# Patient Record
Sex: Male | Born: 1955 | Race: White | Hispanic: No | Marital: Single | Smoking: Never smoker
Health system: Southern US, Community
[De-identification: ages and names within clinical notes are randomized; demographics above are authoritative.]

## PROBLEM LIST (undated history)

## (undated) DIAGNOSIS — I10 Essential (primary) hypertension: Secondary | ICD-10-CM

## (undated) DIAGNOSIS — I2510 Atherosclerotic heart disease of native coronary artery without angina pectoris: Secondary | ICD-10-CM

## (undated) DIAGNOSIS — N289 Disorder of kidney and ureter, unspecified: Secondary | ICD-10-CM

## (undated) DIAGNOSIS — J309 Allergic rhinitis, unspecified: Secondary | ICD-10-CM

## (undated) DIAGNOSIS — J9600 Acute respiratory failure, unspecified whether with hypoxia or hypercapnia: Secondary | ICD-10-CM

## (undated) DIAGNOSIS — D649 Anemia, unspecified: Secondary | ICD-10-CM

## (undated) DIAGNOSIS — E785 Hyperlipidemia, unspecified: Secondary | ICD-10-CM

## (undated) DIAGNOSIS — S1093XA Contusion of unspecified part of neck, initial encounter: Secondary | ICD-10-CM

## (undated) DIAGNOSIS — N529 Male erectile dysfunction, unspecified: Secondary | ICD-10-CM

## (undated) DIAGNOSIS — H9313 Tinnitus, bilateral: Secondary | ICD-10-CM

## (undated) DIAGNOSIS — N183 Chronic kidney disease, stage 3 unspecified: Secondary | ICD-10-CM

## (undated) HISTORY — PX: CARDIAC SURGERY: SHX584

## (undated) HISTORY — DX: Essential (primary) hypertension: I10

## (undated) HISTORY — PX: CARDIAC CATHETERIZATION: SHX172

## (undated) HISTORY — DX: Acute respiratory failure, unspecified whether with hypoxia or hypercapnia: J96.00

## (undated) HISTORY — DX: Tinnitus, bilateral: H93.13

## (undated) HISTORY — DX: Chronic kidney disease, stage 3 unspecified: N18.30

## (undated) HISTORY — DX: Contusion of unspecified part of neck, initial encounter: S10.93XA

## (undated) HISTORY — DX: Anemia, unspecified: D64.9

## (undated) HISTORY — DX: Male erectile dysfunction, unspecified: N52.9

## (undated) HISTORY — PX: ADENOIDECTOMY: SUR15

## (undated) HISTORY — DX: Allergic rhinitis, unspecified: J30.9

## (undated) HISTORY — DX: Hyperlipidemia, unspecified: E78.5

## (undated) HISTORY — DX: Disorder of kidney and ureter, unspecified: N28.9

## (undated) HISTORY — PX: COLONOSCOPY: SHX174

## (undated) HISTORY — DX: Atherosclerotic heart disease of native coronary artery without angina pectoris: I25.10

---

## 2010-05-10 DIAGNOSIS — IMO0001 Reserved for inherently not codable concepts without codable children: Secondary | ICD-10-CM

## 2010-05-10 DIAGNOSIS — E1165 Type 2 diabetes mellitus with hyperglycemia: Secondary | ICD-10-CM

## 2010-06-20 ENCOUNTER — Encounter (INDEPENDENT_AMBULATORY_CARE_PROVIDER_SITE_OTHER): Payer: BC Managed Care – PPO

## 2010-06-20 DIAGNOSIS — I2510 Atherosclerotic heart disease of native coronary artery without angina pectoris: Secondary | ICD-10-CM

## 2010-06-21 NOTE — Assessment & Plan Note (Unsigned)
[facility] [place] OFFICE VISIT  RONDELL, JOHNKE DOB:  Sep 04, 1955                                        June 20, 2010 CHART #:  [id number]  We saw Mr. Jihan Lauby in the clinic today following his coronary bypass grafting.  Mr. Wisor is doing well.  His sternum is stable and his wounds are well healed.  He has started on walking program and he is managing his diabetes better than he was prior to surgery.  Mr. Madriaga has seen the cardiologist and they are pleased with his progress.  He did inquire about [facility] and will begin that as soon as they contact him.  He will contact [facility] himself if they have not contacted him within a week or so.  Mr. Pomplun was reminded of his sternal precautions, not to lift any more than 15 pounds for the next 6 weeks, and he will return to see [place] on a p.r.n. basis.  Ishmael Holter. Jerelene Redden, MD  BC/MEDQ  D:  06/20/2010  T:  06/21/2010  Job:  [id number]

## 2012-05-25 HISTORY — PX: COLONOSCOPY: SHX174

## 2015-03-24 DIAGNOSIS — E785 Hyperlipidemia, unspecified: Secondary | ICD-10-CM

## 2015-03-24 DIAGNOSIS — Z951 Presence of aortocoronary bypass graft: Secondary | ICD-10-CM

## 2015-03-24 DIAGNOSIS — E119 Type 2 diabetes mellitus without complications: Secondary | ICD-10-CM

## 2015-03-24 DIAGNOSIS — G4733 Obstructive sleep apnea (adult) (pediatric): Secondary | ICD-10-CM

## 2015-03-24 HISTORY — DX: Morbid (severe) obesity due to excess calories: E66.01

## 2015-03-24 HISTORY — DX: Type 2 diabetes mellitus without complications: E11.9

## 2015-03-24 HISTORY — DX: Obstructive sleep apnea (adult) (pediatric): G47.33

## 2015-03-24 HISTORY — DX: Presence of aortocoronary bypass graft: Z95.1

## 2015-03-24 HISTORY — DX: Hyperlipidemia, unspecified: E78.5

## 2018-04-10 DIAGNOSIS — N529 Male erectile dysfunction, unspecified: Secondary | ICD-10-CM | POA: Diagnosis not present

## 2018-04-10 DIAGNOSIS — F329 Major depressive disorder, single episode, unspecified: Secondary | ICD-10-CM | POA: Diagnosis not present

## 2018-04-10 DIAGNOSIS — J208 Acute bronchitis due to other specified organisms: Secondary | ICD-10-CM | POA: Diagnosis not present

## 2018-04-10 DIAGNOSIS — J309 Allergic rhinitis, unspecified: Secondary | ICD-10-CM | POA: Diagnosis not present

## 2018-04-23 DIAGNOSIS — I10 Essential (primary) hypertension: Secondary | ICD-10-CM | POA: Diagnosis not present

## 2018-04-23 DIAGNOSIS — N529 Male erectile dysfunction, unspecified: Secondary | ICD-10-CM | POA: Diagnosis not present

## 2018-04-23 DIAGNOSIS — E119 Type 2 diabetes mellitus without complications: Secondary | ICD-10-CM | POA: Diagnosis not present

## 2018-04-23 DIAGNOSIS — F329 Major depressive disorder, single episode, unspecified: Secondary | ICD-10-CM | POA: Diagnosis not present

## 2018-04-23 DIAGNOSIS — J309 Allergic rhinitis, unspecified: Secondary | ICD-10-CM | POA: Diagnosis not present

## 2018-05-11 DIAGNOSIS — E119 Type 2 diabetes mellitus without complications: Secondary | ICD-10-CM | POA: Diagnosis not present

## 2018-05-11 DIAGNOSIS — N529 Male erectile dysfunction, unspecified: Secondary | ICD-10-CM | POA: Diagnosis not present

## 2018-05-11 DIAGNOSIS — F329 Major depressive disorder, single episode, unspecified: Secondary | ICD-10-CM | POA: Diagnosis not present

## 2018-05-11 DIAGNOSIS — J309 Allergic rhinitis, unspecified: Secondary | ICD-10-CM | POA: Diagnosis not present

## 2018-05-25 DIAGNOSIS — E119 Type 2 diabetes mellitus without complications: Secondary | ICD-10-CM | POA: Diagnosis not present

## 2018-05-25 DIAGNOSIS — J309 Allergic rhinitis, unspecified: Secondary | ICD-10-CM | POA: Diagnosis not present

## 2018-05-25 DIAGNOSIS — N529 Male erectile dysfunction, unspecified: Secondary | ICD-10-CM | POA: Diagnosis not present

## 2018-05-25 DIAGNOSIS — F329 Major depressive disorder, single episode, unspecified: Secondary | ICD-10-CM | POA: Diagnosis not present

## 2018-06-17 DIAGNOSIS — E119 Type 2 diabetes mellitus without complications: Secondary | ICD-10-CM | POA: Diagnosis not present

## 2018-06-17 DIAGNOSIS — J309 Allergic rhinitis, unspecified: Secondary | ICD-10-CM | POA: Diagnosis not present

## 2018-06-17 DIAGNOSIS — E785 Hyperlipidemia, unspecified: Secondary | ICD-10-CM | POA: Diagnosis not present

## 2018-06-17 DIAGNOSIS — I10 Essential (primary) hypertension: Secondary | ICD-10-CM | POA: Diagnosis not present

## 2018-06-17 DIAGNOSIS — N529 Male erectile dysfunction, unspecified: Secondary | ICD-10-CM | POA: Diagnosis not present

## 2018-06-17 DIAGNOSIS — F329 Major depressive disorder, single episode, unspecified: Secondary | ICD-10-CM | POA: Diagnosis not present

## 2018-06-30 DIAGNOSIS — N183 Chronic kidney disease, stage 3 (moderate): Secondary | ICD-10-CM | POA: Diagnosis not present

## 2018-06-30 DIAGNOSIS — N529 Male erectile dysfunction, unspecified: Secondary | ICD-10-CM | POA: Diagnosis not present

## 2018-06-30 DIAGNOSIS — E119 Type 2 diabetes mellitus without complications: Secondary | ICD-10-CM | POA: Diagnosis not present

## 2018-06-30 DIAGNOSIS — F329 Major depressive disorder, single episode, unspecified: Secondary | ICD-10-CM | POA: Diagnosis not present

## 2018-09-16 DIAGNOSIS — Z1331 Encounter for screening for depression: Secondary | ICD-10-CM | POA: Diagnosis not present

## 2018-09-16 DIAGNOSIS — I10 Essential (primary) hypertension: Secondary | ICD-10-CM | POA: Diagnosis not present

## 2018-09-16 DIAGNOSIS — F329 Major depressive disorder, single episode, unspecified: Secondary | ICD-10-CM | POA: Diagnosis not present

## 2018-09-16 DIAGNOSIS — H9313 Tinnitus, bilateral: Secondary | ICD-10-CM | POA: Diagnosis not present

## 2018-09-16 DIAGNOSIS — J309 Allergic rhinitis, unspecified: Secondary | ICD-10-CM | POA: Diagnosis not present

## 2018-09-16 DIAGNOSIS — E785 Hyperlipidemia, unspecified: Secondary | ICD-10-CM | POA: Diagnosis not present

## 2018-09-16 DIAGNOSIS — E119 Type 2 diabetes mellitus without complications: Secondary | ICD-10-CM | POA: Diagnosis not present

## 2018-09-28 DIAGNOSIS — H9313 Tinnitus, bilateral: Secondary | ICD-10-CM | POA: Diagnosis not present

## 2018-09-28 DIAGNOSIS — Z9889 Other specified postprocedural states: Secondary | ICD-10-CM | POA: Diagnosis not present

## 2018-09-28 DIAGNOSIS — D649 Anemia, unspecified: Secondary | ICD-10-CM | POA: Diagnosis not present

## 2018-09-28 DIAGNOSIS — H9193 Unspecified hearing loss, bilateral: Secondary | ICD-10-CM | POA: Diagnosis not present

## 2018-09-28 DIAGNOSIS — Z822 Family history of deafness and hearing loss: Secondary | ICD-10-CM | POA: Diagnosis not present

## 2018-09-28 DIAGNOSIS — I10 Essential (primary) hypertension: Secondary | ICD-10-CM | POA: Diagnosis not present

## 2018-09-28 DIAGNOSIS — N183 Chronic kidney disease, stage 3 (moderate): Secondary | ICD-10-CM | POA: Diagnosis not present

## 2018-09-28 DIAGNOSIS — H903 Sensorineural hearing loss, bilateral: Secondary | ICD-10-CM | POA: Diagnosis not present

## 2018-09-28 DIAGNOSIS — E785 Hyperlipidemia, unspecified: Secondary | ICD-10-CM | POA: Diagnosis not present

## 2018-10-12 DIAGNOSIS — I10 Essential (primary) hypertension: Secondary | ICD-10-CM | POA: Diagnosis not present

## 2018-10-12 DIAGNOSIS — E785 Hyperlipidemia, unspecified: Secondary | ICD-10-CM | POA: Diagnosis not present

## 2018-10-12 DIAGNOSIS — L0390 Cellulitis, unspecified: Secondary | ICD-10-CM | POA: Diagnosis not present

## 2018-10-12 DIAGNOSIS — N183 Chronic kidney disease, stage 3 (moderate): Secondary | ICD-10-CM | POA: Diagnosis not present

## 2018-10-20 DIAGNOSIS — L0390 Cellulitis, unspecified: Secondary | ICD-10-CM | POA: Diagnosis not present

## 2018-10-20 DIAGNOSIS — I10 Essential (primary) hypertension: Secondary | ICD-10-CM | POA: Diagnosis not present

## 2018-10-20 DIAGNOSIS — E785 Hyperlipidemia, unspecified: Secondary | ICD-10-CM | POA: Diagnosis not present

## 2018-10-20 DIAGNOSIS — N529 Male erectile dysfunction, unspecified: Secondary | ICD-10-CM | POA: Diagnosis not present

## 2018-11-26 DIAGNOSIS — N529 Male erectile dysfunction, unspecified: Secondary | ICD-10-CM | POA: Diagnosis not present

## 2018-11-26 DIAGNOSIS — E785 Hyperlipidemia, unspecified: Secondary | ICD-10-CM | POA: Diagnosis not present

## 2018-11-26 DIAGNOSIS — I10 Essential (primary) hypertension: Secondary | ICD-10-CM | POA: Diagnosis not present

## 2018-11-26 DIAGNOSIS — L0390 Cellulitis, unspecified: Secondary | ICD-10-CM | POA: Diagnosis not present

## 2018-12-02 DIAGNOSIS — L0390 Cellulitis, unspecified: Secondary | ICD-10-CM | POA: Diagnosis not present

## 2018-12-02 DIAGNOSIS — I10 Essential (primary) hypertension: Secondary | ICD-10-CM | POA: Diagnosis not present

## 2018-12-02 DIAGNOSIS — E119 Type 2 diabetes mellitus without complications: Secondary | ICD-10-CM | POA: Diagnosis not present

## 2018-12-02 DIAGNOSIS — N529 Male erectile dysfunction, unspecified: Secondary | ICD-10-CM | POA: Diagnosis not present

## 2018-12-02 DIAGNOSIS — E785 Hyperlipidemia, unspecified: Secondary | ICD-10-CM | POA: Diagnosis not present

## 2018-12-09 DIAGNOSIS — E785 Hyperlipidemia, unspecified: Secondary | ICD-10-CM | POA: Diagnosis not present

## 2018-12-09 DIAGNOSIS — I10 Essential (primary) hypertension: Secondary | ICD-10-CM | POA: Diagnosis not present

## 2018-12-09 DIAGNOSIS — E162 Hypoglycemia, unspecified: Secondary | ICD-10-CM | POA: Diagnosis not present

## 2018-12-09 DIAGNOSIS — E118 Type 2 diabetes mellitus with unspecified complications: Secondary | ICD-10-CM | POA: Diagnosis not present

## 2020-02-18 DIAGNOSIS — Z139 Encounter for screening, unspecified: Secondary | ICD-10-CM | POA: Diagnosis not present

## 2020-02-18 DIAGNOSIS — E669 Obesity, unspecified: Secondary | ICD-10-CM | POA: Diagnosis not present

## 2020-02-18 DIAGNOSIS — Z9181 History of falling: Secondary | ICD-10-CM | POA: Diagnosis not present

## 2020-02-18 DIAGNOSIS — Z0000 Encounter for general adult medical examination without abnormal findings: Secondary | ICD-10-CM | POA: Diagnosis not present

## 2020-02-18 DIAGNOSIS — Z1331 Encounter for screening for depression: Secondary | ICD-10-CM | POA: Diagnosis not present

## 2020-02-18 DIAGNOSIS — E785 Hyperlipidemia, unspecified: Secondary | ICD-10-CM | POA: Diagnosis not present

## 2020-06-09 DIAGNOSIS — N1832 Chronic kidney disease, stage 3b: Secondary | ICD-10-CM | POA: Diagnosis not present

## 2020-06-09 DIAGNOSIS — J309 Allergic rhinitis, unspecified: Secondary | ICD-10-CM | POA: Diagnosis not present

## 2020-06-09 DIAGNOSIS — L309 Dermatitis, unspecified: Secondary | ICD-10-CM | POA: Diagnosis not present

## 2020-06-09 DIAGNOSIS — E1169 Type 2 diabetes mellitus with other specified complication: Secondary | ICD-10-CM | POA: Diagnosis not present

## 2020-06-09 DIAGNOSIS — D649 Anemia, unspecified: Secondary | ICD-10-CM | POA: Diagnosis not present

## 2020-06-09 DIAGNOSIS — N529 Male erectile dysfunction, unspecified: Secondary | ICD-10-CM | POA: Diagnosis not present

## 2020-06-09 DIAGNOSIS — F3341 Major depressive disorder, recurrent, in partial remission: Secondary | ICD-10-CM | POA: Diagnosis not present

## 2020-06-09 DIAGNOSIS — H9313 Tinnitus, bilateral: Secondary | ICD-10-CM | POA: Diagnosis not present

## 2020-06-09 DIAGNOSIS — E785 Hyperlipidemia, unspecified: Secondary | ICD-10-CM | POA: Diagnosis not present

## 2020-06-09 DIAGNOSIS — I10 Essential (primary) hypertension: Secondary | ICD-10-CM | POA: Diagnosis not present

## 2020-06-09 DIAGNOSIS — I2510 Atherosclerotic heart disease of native coronary artery without angina pectoris: Secondary | ICD-10-CM | POA: Diagnosis not present

## 2020-06-26 DIAGNOSIS — J309 Allergic rhinitis, unspecified: Secondary | ICD-10-CM | POA: Diagnosis not present

## 2020-06-26 DIAGNOSIS — E785 Hyperlipidemia, unspecified: Secondary | ICD-10-CM | POA: Diagnosis not present

## 2020-06-26 DIAGNOSIS — E1169 Type 2 diabetes mellitus with other specified complication: Secondary | ICD-10-CM | POA: Diagnosis not present

## 2020-06-26 DIAGNOSIS — I2510 Atherosclerotic heart disease of native coronary artery without angina pectoris: Secondary | ICD-10-CM | POA: Diagnosis not present

## 2020-06-26 DIAGNOSIS — D649 Anemia, unspecified: Secondary | ICD-10-CM | POA: Diagnosis not present

## 2020-06-26 DIAGNOSIS — I10 Essential (primary) hypertension: Secondary | ICD-10-CM | POA: Diagnosis not present

## 2020-06-26 DIAGNOSIS — F3341 Major depressive disorder, recurrent, in partial remission: Secondary | ICD-10-CM | POA: Diagnosis not present

## 2020-06-26 DIAGNOSIS — N1832 Chronic kidney disease, stage 3b: Secondary | ICD-10-CM | POA: Diagnosis not present

## 2020-06-26 DIAGNOSIS — N529 Male erectile dysfunction, unspecified: Secondary | ICD-10-CM | POA: Diagnosis not present

## 2020-06-26 DIAGNOSIS — H9313 Tinnitus, bilateral: Secondary | ICD-10-CM | POA: Diagnosis not present

## 2020-06-26 DIAGNOSIS — L309 Dermatitis, unspecified: Secondary | ICD-10-CM | POA: Diagnosis not present

## 2020-07-10 DIAGNOSIS — J309 Allergic rhinitis, unspecified: Secondary | ICD-10-CM | POA: Diagnosis not present

## 2020-07-10 DIAGNOSIS — N529 Male erectile dysfunction, unspecified: Secondary | ICD-10-CM | POA: Diagnosis not present

## 2020-07-10 DIAGNOSIS — I10 Essential (primary) hypertension: Secondary | ICD-10-CM | POA: Diagnosis not present

## 2020-07-10 DIAGNOSIS — L309 Dermatitis, unspecified: Secondary | ICD-10-CM | POA: Diagnosis not present

## 2020-07-10 DIAGNOSIS — I2510 Atherosclerotic heart disease of native coronary artery without angina pectoris: Secondary | ICD-10-CM | POA: Diagnosis not present

## 2020-07-10 DIAGNOSIS — E1169 Type 2 diabetes mellitus with other specified complication: Secondary | ICD-10-CM | POA: Diagnosis not present

## 2020-07-10 DIAGNOSIS — H9313 Tinnitus, bilateral: Secondary | ICD-10-CM | POA: Diagnosis not present

## 2020-07-10 DIAGNOSIS — F3341 Major depressive disorder, recurrent, in partial remission: Secondary | ICD-10-CM | POA: Diagnosis not present

## 2020-07-10 DIAGNOSIS — E785 Hyperlipidemia, unspecified: Secondary | ICD-10-CM | POA: Diagnosis not present

## 2020-07-10 DIAGNOSIS — N1832 Chronic kidney disease, stage 3b: Secondary | ICD-10-CM | POA: Diagnosis not present

## 2020-07-10 DIAGNOSIS — D649 Anemia, unspecified: Secondary | ICD-10-CM | POA: Diagnosis not present

## 2020-07-20 DIAGNOSIS — N529 Male erectile dysfunction, unspecified: Secondary | ICD-10-CM | POA: Diagnosis not present

## 2020-07-20 DIAGNOSIS — E1169 Type 2 diabetes mellitus with other specified complication: Secondary | ICD-10-CM | POA: Diagnosis not present

## 2020-07-20 DIAGNOSIS — J309 Allergic rhinitis, unspecified: Secondary | ICD-10-CM | POA: Diagnosis not present

## 2020-07-20 DIAGNOSIS — R42 Dizziness and giddiness: Secondary | ICD-10-CM | POA: Diagnosis not present

## 2020-07-20 DIAGNOSIS — N1832 Chronic kidney disease, stage 3b: Secondary | ICD-10-CM | POA: Diagnosis not present

## 2020-07-20 DIAGNOSIS — H9313 Tinnitus, bilateral: Secondary | ICD-10-CM | POA: Diagnosis not present

## 2020-07-20 DIAGNOSIS — I2510 Atherosclerotic heart disease of native coronary artery without angina pectoris: Secondary | ICD-10-CM | POA: Diagnosis not present

## 2020-07-20 DIAGNOSIS — D649 Anemia, unspecified: Secondary | ICD-10-CM | POA: Diagnosis not present

## 2020-07-20 DIAGNOSIS — F3341 Major depressive disorder, recurrent, in partial remission: Secondary | ICD-10-CM | POA: Diagnosis not present

## 2020-07-20 DIAGNOSIS — E785 Hyperlipidemia, unspecified: Secondary | ICD-10-CM | POA: Diagnosis not present

## 2020-07-20 DIAGNOSIS — L309 Dermatitis, unspecified: Secondary | ICD-10-CM | POA: Diagnosis not present

## 2020-07-24 DIAGNOSIS — I2510 Atherosclerotic heart disease of native coronary artery without angina pectoris: Secondary | ICD-10-CM | POA: Diagnosis not present

## 2020-07-24 DIAGNOSIS — E1169 Type 2 diabetes mellitus with other specified complication: Secondary | ICD-10-CM | POA: Diagnosis not present

## 2020-07-24 DIAGNOSIS — D649 Anemia, unspecified: Secondary | ICD-10-CM | POA: Diagnosis not present

## 2020-07-24 DIAGNOSIS — J309 Allergic rhinitis, unspecified: Secondary | ICD-10-CM | POA: Diagnosis not present

## 2020-07-24 DIAGNOSIS — R42 Dizziness and giddiness: Secondary | ICD-10-CM | POA: Diagnosis not present

## 2020-07-24 DIAGNOSIS — L309 Dermatitis, unspecified: Secondary | ICD-10-CM | POA: Diagnosis not present

## 2020-07-24 DIAGNOSIS — H9313 Tinnitus, bilateral: Secondary | ICD-10-CM | POA: Diagnosis not present

## 2020-07-24 DIAGNOSIS — E785 Hyperlipidemia, unspecified: Secondary | ICD-10-CM | POA: Diagnosis not present

## 2020-07-24 DIAGNOSIS — N1832 Chronic kidney disease, stage 3b: Secondary | ICD-10-CM | POA: Diagnosis not present

## 2020-07-24 DIAGNOSIS — N529 Male erectile dysfunction, unspecified: Secondary | ICD-10-CM | POA: Diagnosis not present

## 2020-07-24 DIAGNOSIS — F3341 Major depressive disorder, recurrent, in partial remission: Secondary | ICD-10-CM | POA: Diagnosis not present

## 2020-09-06 DIAGNOSIS — J309 Allergic rhinitis, unspecified: Secondary | ICD-10-CM | POA: Diagnosis not present

## 2020-09-06 DIAGNOSIS — D649 Anemia, unspecified: Secondary | ICD-10-CM | POA: Diagnosis not present

## 2020-09-06 DIAGNOSIS — I10 Essential (primary) hypertension: Secondary | ICD-10-CM | POA: Diagnosis not present

## 2020-09-06 DIAGNOSIS — F3341 Major depressive disorder, recurrent, in partial remission: Secondary | ICD-10-CM | POA: Diagnosis not present

## 2020-09-06 DIAGNOSIS — L309 Dermatitis, unspecified: Secondary | ICD-10-CM | POA: Diagnosis not present

## 2020-09-06 DIAGNOSIS — E1169 Type 2 diabetes mellitus with other specified complication: Secondary | ICD-10-CM | POA: Diagnosis not present

## 2020-09-06 DIAGNOSIS — I2510 Atherosclerotic heart disease of native coronary artery without angina pectoris: Secondary | ICD-10-CM | POA: Diagnosis not present

## 2020-09-06 DIAGNOSIS — N529 Male erectile dysfunction, unspecified: Secondary | ICD-10-CM | POA: Diagnosis not present

## 2020-09-06 DIAGNOSIS — N1832 Chronic kidney disease, stage 3b: Secondary | ICD-10-CM | POA: Diagnosis not present

## 2020-09-06 DIAGNOSIS — E785 Hyperlipidemia, unspecified: Secondary | ICD-10-CM | POA: Diagnosis not present

## 2020-09-06 DIAGNOSIS — H9313 Tinnitus, bilateral: Secondary | ICD-10-CM | POA: Diagnosis not present

## 2020-09-07 DIAGNOSIS — E785 Hyperlipidemia, unspecified: Secondary | ICD-10-CM | POA: Diagnosis not present

## 2020-09-07 DIAGNOSIS — L309 Dermatitis, unspecified: Secondary | ICD-10-CM | POA: Diagnosis not present

## 2020-09-07 DIAGNOSIS — J309 Allergic rhinitis, unspecified: Secondary | ICD-10-CM | POA: Diagnosis not present

## 2020-09-07 DIAGNOSIS — F3341 Major depressive disorder, recurrent, in partial remission: Secondary | ICD-10-CM | POA: Diagnosis not present

## 2020-09-07 DIAGNOSIS — N529 Male erectile dysfunction, unspecified: Secondary | ICD-10-CM | POA: Diagnosis not present

## 2020-09-07 DIAGNOSIS — H9313 Tinnitus, bilateral: Secondary | ICD-10-CM | POA: Diagnosis not present

## 2020-09-07 DIAGNOSIS — I2510 Atherosclerotic heart disease of native coronary artery without angina pectoris: Secondary | ICD-10-CM | POA: Diagnosis not present

## 2020-09-07 DIAGNOSIS — D649 Anemia, unspecified: Secondary | ICD-10-CM | POA: Diagnosis not present

## 2020-09-07 DIAGNOSIS — E1169 Type 2 diabetes mellitus with other specified complication: Secondary | ICD-10-CM | POA: Diagnosis not present

## 2020-09-07 DIAGNOSIS — I10 Essential (primary) hypertension: Secondary | ICD-10-CM | POA: Diagnosis not present

## 2020-09-07 DIAGNOSIS — N1832 Chronic kidney disease, stage 3b: Secondary | ICD-10-CM | POA: Diagnosis not present

## 2020-09-10 DIAGNOSIS — M19022 Primary osteoarthritis, left elbow: Secondary | ICD-10-CM | POA: Diagnosis not present

## 2020-09-10 DIAGNOSIS — Z041 Encounter for examination and observation following transport accident: Secondary | ICD-10-CM | POA: Diagnosis not present

## 2020-09-10 DIAGNOSIS — I10 Essential (primary) hypertension: Secondary | ICD-10-CM | POA: Diagnosis not present

## 2020-09-10 DIAGNOSIS — R0902 Hypoxemia: Secondary | ICD-10-CM | POA: Diagnosis not present

## 2020-09-12 ENCOUNTER — Encounter: Payer: Self-pay | Admitting: Cardiology

## 2020-09-12 ENCOUNTER — Encounter: Payer: Self-pay | Admitting: *Deleted

## 2020-09-14 DIAGNOSIS — L309 Dermatitis, unspecified: Secondary | ICD-10-CM | POA: Diagnosis not present

## 2020-09-14 DIAGNOSIS — N1832 Chronic kidney disease, stage 3b: Secondary | ICD-10-CM | POA: Diagnosis not present

## 2020-09-14 DIAGNOSIS — D649 Anemia, unspecified: Secondary | ICD-10-CM | POA: Diagnosis not present

## 2020-09-14 DIAGNOSIS — E785 Hyperlipidemia, unspecified: Secondary | ICD-10-CM | POA: Diagnosis not present

## 2020-09-14 DIAGNOSIS — E1169 Type 2 diabetes mellitus with other specified complication: Secondary | ICD-10-CM | POA: Diagnosis not present

## 2020-09-14 DIAGNOSIS — Z6841 Body Mass Index (BMI) 40.0 and over, adult: Secondary | ICD-10-CM | POA: Diagnosis not present

## 2020-09-14 DIAGNOSIS — I10 Essential (primary) hypertension: Secondary | ICD-10-CM | POA: Diagnosis not present

## 2020-09-14 DIAGNOSIS — F3341 Major depressive disorder, recurrent, in partial remission: Secondary | ICD-10-CM | POA: Diagnosis not present

## 2020-09-14 DIAGNOSIS — J309 Allergic rhinitis, unspecified: Secondary | ICD-10-CM | POA: Diagnosis not present

## 2020-09-14 DIAGNOSIS — H9313 Tinnitus, bilateral: Secondary | ICD-10-CM | POA: Diagnosis not present

## 2020-09-14 DIAGNOSIS — N529 Male erectile dysfunction, unspecified: Secondary | ICD-10-CM | POA: Diagnosis not present

## 2020-10-04 DIAGNOSIS — N1832 Chronic kidney disease, stage 3b: Secondary | ICD-10-CM | POA: Diagnosis not present

## 2020-10-04 DIAGNOSIS — I10 Essential (primary) hypertension: Secondary | ICD-10-CM | POA: Diagnosis not present

## 2020-10-04 DIAGNOSIS — J309 Allergic rhinitis, unspecified: Secondary | ICD-10-CM | POA: Diagnosis not present

## 2020-10-04 DIAGNOSIS — I2510 Atherosclerotic heart disease of native coronary artery without angina pectoris: Secondary | ICD-10-CM | POA: Diagnosis not present

## 2020-10-04 DIAGNOSIS — M545 Low back pain, unspecified: Secondary | ICD-10-CM | POA: Diagnosis not present

## 2020-10-04 DIAGNOSIS — H9313 Tinnitus, bilateral: Secondary | ICD-10-CM | POA: Diagnosis not present

## 2020-10-04 DIAGNOSIS — F3341 Major depressive disorder, recurrent, in partial remission: Secondary | ICD-10-CM | POA: Diagnosis not present

## 2020-10-04 DIAGNOSIS — N529 Male erectile dysfunction, unspecified: Secondary | ICD-10-CM | POA: Diagnosis not present

## 2020-10-04 DIAGNOSIS — L309 Dermatitis, unspecified: Secondary | ICD-10-CM | POA: Diagnosis not present

## 2020-10-04 DIAGNOSIS — E785 Hyperlipidemia, unspecified: Secondary | ICD-10-CM | POA: Diagnosis not present

## 2020-10-04 DIAGNOSIS — D649 Anemia, unspecified: Secondary | ICD-10-CM | POA: Diagnosis not present

## 2020-10-04 DIAGNOSIS — E1169 Type 2 diabetes mellitus with other specified complication: Secondary | ICD-10-CM | POA: Diagnosis not present

## 2020-10-12 ENCOUNTER — Ambulatory Visit: Payer: Medicare Other | Admitting: Cardiology

## 2020-10-26 DIAGNOSIS — M5442 Lumbago with sciatica, left side: Secondary | ICD-10-CM | POA: Diagnosis not present

## 2020-10-27 DIAGNOSIS — I2510 Atherosclerotic heart disease of native coronary artery without angina pectoris: Secondary | ICD-10-CM | POA: Insufficient documentation

## 2020-10-27 DIAGNOSIS — J309 Allergic rhinitis, unspecified: Secondary | ICD-10-CM | POA: Insufficient documentation

## 2020-10-27 DIAGNOSIS — N529 Male erectile dysfunction, unspecified: Secondary | ICD-10-CM | POA: Insufficient documentation

## 2020-10-27 DIAGNOSIS — D649 Anemia, unspecified: Secondary | ICD-10-CM | POA: Insufficient documentation

## 2020-10-27 DIAGNOSIS — N289 Disorder of kidney and ureter, unspecified: Secondary | ICD-10-CM | POA: Insufficient documentation

## 2020-10-27 DIAGNOSIS — H9313 Tinnitus, bilateral: Secondary | ICD-10-CM | POA: Insufficient documentation

## 2020-10-27 DIAGNOSIS — N183 Chronic kidney disease, stage 3 unspecified: Secondary | ICD-10-CM | POA: Insufficient documentation

## 2020-10-27 DIAGNOSIS — I10 Essential (primary) hypertension: Secondary | ICD-10-CM | POA: Insufficient documentation

## 2020-11-01 DIAGNOSIS — L03213 Periorbital cellulitis: Secondary | ICD-10-CM | POA: Diagnosis not present

## 2020-11-01 DIAGNOSIS — N529 Male erectile dysfunction, unspecified: Secondary | ICD-10-CM | POA: Diagnosis not present

## 2020-11-01 DIAGNOSIS — N1832 Chronic kidney disease, stage 3b: Secondary | ICD-10-CM | POA: Diagnosis not present

## 2020-11-01 DIAGNOSIS — E785 Hyperlipidemia, unspecified: Secondary | ICD-10-CM | POA: Diagnosis not present

## 2020-11-01 DIAGNOSIS — I2510 Atherosclerotic heart disease of native coronary artery without angina pectoris: Secondary | ICD-10-CM | POA: Diagnosis not present

## 2020-11-01 DIAGNOSIS — M6281 Muscle weakness (generalized): Secondary | ICD-10-CM | POA: Diagnosis not present

## 2020-11-01 DIAGNOSIS — D649 Anemia, unspecified: Secondary | ICD-10-CM | POA: Diagnosis not present

## 2020-11-01 DIAGNOSIS — M545 Low back pain, unspecified: Secondary | ICD-10-CM | POA: Diagnosis not present

## 2020-11-01 DIAGNOSIS — F3341 Major depressive disorder, recurrent, in partial remission: Secondary | ICD-10-CM | POA: Diagnosis not present

## 2020-11-01 DIAGNOSIS — E1169 Type 2 diabetes mellitus with other specified complication: Secondary | ICD-10-CM | POA: Diagnosis not present

## 2020-11-01 DIAGNOSIS — L309 Dermatitis, unspecified: Secondary | ICD-10-CM | POA: Diagnosis not present

## 2020-11-01 DIAGNOSIS — H9313 Tinnitus, bilateral: Secondary | ICD-10-CM | POA: Diagnosis not present

## 2020-11-01 DIAGNOSIS — J309 Allergic rhinitis, unspecified: Secondary | ICD-10-CM | POA: Diagnosis not present

## 2020-11-01 DIAGNOSIS — R293 Abnormal posture: Secondary | ICD-10-CM | POA: Diagnosis not present

## 2020-11-01 DIAGNOSIS — M2560 Stiffness of unspecified joint, not elsewhere classified: Secondary | ICD-10-CM | POA: Diagnosis not present

## 2020-11-07 DIAGNOSIS — I2510 Atherosclerotic heart disease of native coronary artery without angina pectoris: Secondary | ICD-10-CM | POA: Diagnosis not present

## 2020-11-07 DIAGNOSIS — R293 Abnormal posture: Secondary | ICD-10-CM | POA: Diagnosis not present

## 2020-11-07 DIAGNOSIS — D649 Anemia, unspecified: Secondary | ICD-10-CM | POA: Diagnosis not present

## 2020-11-07 DIAGNOSIS — F3341 Major depressive disorder, recurrent, in partial remission: Secondary | ICD-10-CM | POA: Diagnosis not present

## 2020-11-07 DIAGNOSIS — E1169 Type 2 diabetes mellitus with other specified complication: Secondary | ICD-10-CM | POA: Diagnosis not present

## 2020-11-07 DIAGNOSIS — E785 Hyperlipidemia, unspecified: Secondary | ICD-10-CM | POA: Diagnosis not present

## 2020-11-07 DIAGNOSIS — M6281 Muscle weakness (generalized): Secondary | ICD-10-CM | POA: Diagnosis not present

## 2020-11-07 DIAGNOSIS — L03213 Periorbital cellulitis: Secondary | ICD-10-CM | POA: Diagnosis not present

## 2020-11-07 DIAGNOSIS — J309 Allergic rhinitis, unspecified: Secondary | ICD-10-CM | POA: Diagnosis not present

## 2020-11-07 DIAGNOSIS — N529 Male erectile dysfunction, unspecified: Secondary | ICD-10-CM | POA: Diagnosis not present

## 2020-11-07 DIAGNOSIS — N1832 Chronic kidney disease, stage 3b: Secondary | ICD-10-CM | POA: Diagnosis not present

## 2020-11-07 DIAGNOSIS — M2560 Stiffness of unspecified joint, not elsewhere classified: Secondary | ICD-10-CM | POA: Diagnosis not present

## 2020-11-07 DIAGNOSIS — M545 Low back pain, unspecified: Secondary | ICD-10-CM | POA: Diagnosis not present

## 2020-11-07 DIAGNOSIS — L309 Dermatitis, unspecified: Secondary | ICD-10-CM | POA: Diagnosis not present

## 2020-11-07 DIAGNOSIS — H9313 Tinnitus, bilateral: Secondary | ICD-10-CM | POA: Diagnosis not present

## 2020-11-09 NOTE — Progress Notes (Signed)
Cardiology Office Note:    Date:  11/10/2020   ID:  Mario Green, DOB [birth year], MRN [id number]  PCP:  Cher Nakai, MD  Cardiologist:  Shirlee More, MD   Referring MD: Cher Nakai, MD  ASSESSMENT:    1. Coronary artery disease of native artery of native heart with stable angina pectoris ([place])   2. CKD (chronic kidney disease), symptom management only, stage 3 (moderate) (HCC)   3. Mixed hyperlipidemia   4. Type 2 diabetes mellitus without complication, unspecified whether long term insulin use (HCC)    PLAN:    In order of problems listed above:  Stable CAD following revascularization having no angina he is on a good medical regimen including clopidogrel beta-blocker but not on a statin or statin equivalent and I am going to place him on a combination bempedoic acid Zetia and if tolerated follow-up labs with his PCP Followed by nephrology awaiting evaluation Poorly controlled initiate bempedoic acid Poorly controlled managed by his PCP A1c above target  Next appointment 6 months   Medication Adjustments/Labs and Tests Ordered: Current medicines are reviewed at length with the patient today.  Concerns regarding medicines are outlined above.  Orders Placed This Encounter  Procedures   EKG 12-Lead   Meds ordered this encounter  Medications   Bempedoic Acid-Ezetimibe 180-10 MG TABS    Sig: Take 1 tablet by mouth daily.    Dispense:  90 tablet    Refill:  3     Chief Complaint  Patient presents with   Coronary Artery Disease    History of Present Illness:    Mario Green is a 65 y.o. male who is being seen today for the evaluation of CAD with a history of CABG December 2016 at the request of Cher Nakai, MD.  He had previously been seen by my partner Dr. Agustin Cree [facility] regional cardiology last 05/06/2016. He had coronary angiography performed 04/24/2015 showing patent grafts and no recommendation for further intervention.  I will cut-and-paste the record from care  everywhere to the chart..  1. Severe LAD/RCA lesions  2. Cir with mild disease  3. All grafts are widely patent (LIMA to LAD, SVG to RCA, SVG to Diag )  4. LVEDP 12, ventriculogram was not performed due to renal insuff. Normal by  recent non-invasive test.  Diagnostic Procedure Recommendations  Medical treatment.   Signatures   Electronically signed by Rozann Lesches, MD(Diagnostic Physician) on   04/24/2015 12:15   Angiographic findings   Cardiac Arteries and Lesion Findings  LMCA: Normal.  LAD:    Lesion on Prox LAD: 95% stenosis.    Lesion on Mid LAD: 95% stenosis.  LCx:    Lesion on Prox CX: 20% stenosis.    Lesion on Mid CX: 40% stenosis.    Lesion on 1st Ob Marg: 30% stenosis.  RCA:    Lesion on Prox RCA: Ostial.100% stenosis.  Cardiac Grafts   -  There is a Vein graft that originates at the Aorta Left and attaches to      the 1st Diag.patent   -  There is a Vein graft that originates at the Aorta Right and attaches to      the Mid RCA.patnet   -  There is a LIMA graft that originates at the LIMA and attaches to the Mid      LAD.patent   In general he has done well since bypass surgery no recurrent hospitalizations is not having angina shortness of breath palpitation or  syncope. At times shifting posture in the bed he feels a click in the sternum there is no instability on exam He has CKD his last creatinine 2.8 and is awaiting follow-up with a nephrologist. He had been in a drug trial with lipid-lowering which was discontinued the last few months he is taking no treatment at this time.  His last cholesterol 257 LDL severely elevated 156 triglycerides 140 and HDL 16. He is statin intolerant. 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 LIMA to LAD, SVG to diagonal 1, SVG to PDA   Tinnitus, bilateral    Type 2 diabetes mellitus without complication ([place]) 0000000    Past Surgical History:  Procedure Laterality Date   ADENOIDECTOMY     CARDIAC SURGERY     Tripple bypass    Current Medications: Current Meds  Medication Sig   amoxicillin-clavulanate (AUGMENTIN) 875-125 MG tablet Take 1 tablet by mouth 2 (two) times daily.   aspirin 81 MG EC tablet Take 81 mg by mouth daily.   Bempedoic Acid-Ezetimibe 180-10 MG TABS Take 1 tablet by mouth daily.   clopidogrel (PLAVIX) 75 MG tablet Take 1 tablet by mouth daily.   Dulaglutide (TRULICITY) 4.5 0000000 SOPN Inject 4.5 mg into the skin once a week.   fenofibrate 160 MG tablet Take 160 mg by mouth daily.   gabapentin (NEURONTIN) 300 MG capsule Take 300 mg by mouth at bedtime.   glimepiride (AMARYL) 4 MG tablet Take 4 mg by mouth daily.   hydrochlorothiazide (HYDRODIURIL) 25 MG tablet Take 25 mg by mouth daily.   lisinopril (ZESTRIL) 10 MG tablet Take 10 mg by mouth daily.   metFORMIN (GLUCOPHAGE) 850 MG tablet Take 850 mg by mouth 3 (three) times daily.   metoprolol tartrate (LOPRESSOR) 100 MG tablet Take 100 mg by mouth daily.   tadalafil (CIALIS) 10 MG tablet Take 10 mg by mouth daily as needed for erectile dysfunction.     Allergies:   Patient has no known allergies.   Social History   Socioeconomic History   Marital status: Single    Spouse name: Not on file   Number of children: Not on file   Years of education: Not on file   Highest education level: Not on file  Occupational History   Not on file  Tobacco Use   Smoking status:  Never   Smokeless tobacco: Former    Types: Chew  Substance and Sexual Activity   Alcohol use: Not Currently    Comment: occasional   Drug use: Never   Sexual activity: Not on file  Other Topics Concern   Not on file  Social History Narrative   Not on file   Social Determinants of Health   Financial Resource Strain: Not on file  Food Insecurity: Not on file  Transportation Needs: Not on file  Physical Activity: Not on file  Stress: Not on file  Social Connections:  Not on file     Family History: The patient's family history includes Dementia in his mother; Diabetes in his father; Heart attack in his father.  ROS:   ROS Please see the history of present illness.     All other systems reviewed and are negative.  EKGs/Labs/Other Studies Reviewed:    The following studies were reviewed today:   EKG:  EKG is  ordered today.  The ekg ordered today is personally reviewed and demonstrates sinus rhythm marked first-degree AV block nonspecific conduction delay    Physical Exam:    VS:  BP 110/70 (BP Location: Left Arm, Patient Position: Sitting, Cuff Size: Normal)   Pulse 68   Ht 5' 11.75" (1.822 m)   Wt (!) 302 lb (137 kg)   SpO2 97%   BMI 41.24 kg/m     Wt Readings from Last 3 Encounters:  11/10/20 (!) 302 lb (137 kg)     GEN: Obese BMI greater than 40 well nourished, well developed in no acute distress HEENT: Normal NECK: No JVD; No carotid bruits LYMPHATICS: No lymphadenopathy CARDIAC: RRR, no murmurs, rubs, gallops RESPIRATORY:  Clear to auscultation without rales, wheezing or rhonchi  ABDOMEN: Soft, non-tender, non-distended MUSCULOSKELETAL:  No edema; No deformity  SKIN: Warm and dry NEUROLOGIC:  Alert and oriented x 3 PSYCHIATRIC:  Normal affect     Signed, Shirlee More, MD  11/10/2020 11:21 AM    [place]

## 2020-11-10 ENCOUNTER — Other Ambulatory Visit: Payer: Self-pay

## 2020-11-10 ENCOUNTER — Encounter: Payer: Self-pay | Admitting: Cardiology

## 2020-11-10 ENCOUNTER — Ambulatory Visit (INDEPENDENT_AMBULATORY_CARE_PROVIDER_SITE_OTHER): Payer: Medicare Other | Admitting: Cardiology

## 2020-11-10 VITALS — BP 110/70 | HR 68 | Ht 71.75 in | Wt 302.0 lb

## 2020-11-10 DIAGNOSIS — N183 Chronic kidney disease, stage 3 unspecified: Secondary | ICD-10-CM

## 2020-11-10 DIAGNOSIS — R293 Abnormal posture: Secondary | ICD-10-CM | POA: Diagnosis not present

## 2020-11-10 DIAGNOSIS — E782 Mixed hyperlipidemia: Secondary | ICD-10-CM | POA: Diagnosis not present

## 2020-11-10 DIAGNOSIS — M545 Low back pain, unspecified: Secondary | ICD-10-CM | POA: Diagnosis not present

## 2020-11-10 DIAGNOSIS — M2560 Stiffness of unspecified joint, not elsewhere classified: Secondary | ICD-10-CM | POA: Diagnosis not present

## 2020-11-10 DIAGNOSIS — E119 Type 2 diabetes mellitus without complications: Secondary | ICD-10-CM | POA: Diagnosis not present

## 2020-11-10 DIAGNOSIS — I25118 Atherosclerotic heart disease of native coronary artery with other forms of angina pectoris: Secondary | ICD-10-CM | POA: Diagnosis not present

## 2020-11-10 DIAGNOSIS — M6281 Muscle weakness (generalized): Secondary | ICD-10-CM | POA: Diagnosis not present

## 2020-11-10 MED ORDER — BEMPEDOIC ACID-EZETIMIBE 180-10 MG PO TABS
1.0000 | ORAL_TABLET | Freq: Every day | ORAL | 3 refills | Status: DC
Start: 2020-11-10 — End: 2021-05-14

## 2020-11-10 NOTE — Patient Instructions (Signed)
Medication Instructions:  Your physician has recommended you make the following change in your medication:  START: Bempedoic acid-zetia 180/10 mg take one tablet by mouth daily.  *If you need a refill on your cardiac medications before your next appointment, please call your pharmacy*   Lab Work: None If you have labs (blood work) drawn today and your tests are completely normal, you will receive your results only by: [place] (if you have MyChart) OR A paper copy in the mail If you have any lab test that is abnormal or we need to change your treatment, we will call you to review the results.   Testing/Procedures: None   Follow-Up: At [facility], you and your health needs are our priority.  As part of our continuing mission to provide you with exceptional heart care, we have created designated Provider Care Teams.  These Care Teams include your primary Cardiologist (physician) and Advanced Practice Providers (APPs -  Physician Assistants and Nurse Practitioners) who all work together to provide you with the care you need, when you need it.  We recommend signing up for the patient portal called "MyChart".  Sign up information is provided on this After Visit Summary.  MyChart is used to connect with patients for Virtual Visits (Telemedicine).  Patients are able to view lab/test results, encounter notes, upcoming appointments, etc.  Non-urgent messages can be sent to your provider as well.   To learn more about what you can do with MyChart, go to [email or web address].    Your next appointment:   6 month(s)  The format for your next appointment:   In Person  Provider:   Shirlee More, MD   Other Instructions

## 2020-11-14 DIAGNOSIS — M2560 Stiffness of unspecified joint, not elsewhere classified: Secondary | ICD-10-CM | POA: Diagnosis not present

## 2020-11-14 DIAGNOSIS — M6281 Muscle weakness (generalized): Secondary | ICD-10-CM | POA: Diagnosis not present

## 2020-11-14 DIAGNOSIS — M545 Low back pain, unspecified: Secondary | ICD-10-CM | POA: Diagnosis not present

## 2020-11-14 DIAGNOSIS — R293 Abnormal posture: Secondary | ICD-10-CM | POA: Diagnosis not present

## 2020-11-17 DIAGNOSIS — E785 Hyperlipidemia, unspecified: Secondary | ICD-10-CM | POA: Diagnosis not present

## 2020-11-17 DIAGNOSIS — E1122 Type 2 diabetes mellitus with diabetic chronic kidney disease: Secondary | ICD-10-CM | POA: Diagnosis not present

## 2020-11-17 DIAGNOSIS — I129 Hypertensive chronic kidney disease with stage 1 through stage 4 chronic kidney disease, or unspecified chronic kidney disease: Secondary | ICD-10-CM | POA: Diagnosis not present

## 2020-11-17 DIAGNOSIS — N1832 Chronic kidney disease, stage 3b: Secondary | ICD-10-CM | POA: Diagnosis not present

## 2020-11-17 DIAGNOSIS — N179 Acute kidney failure, unspecified: Secondary | ICD-10-CM | POA: Diagnosis not present

## 2020-11-20 ENCOUNTER — Other Ambulatory Visit: Payer: Self-pay | Admitting: Internal Medicine

## 2020-11-20 DIAGNOSIS — N1832 Chronic kidney disease, stage 3b: Secondary | ICD-10-CM

## 2020-11-20 DIAGNOSIS — N179 Acute kidney failure, unspecified: Secondary | ICD-10-CM

## 2020-11-20 DIAGNOSIS — E875 Hyperkalemia: Secondary | ICD-10-CM | POA: Diagnosis not present

## 2020-11-21 DIAGNOSIS — H9313 Tinnitus, bilateral: Secondary | ICD-10-CM | POA: Diagnosis not present

## 2020-11-21 DIAGNOSIS — M2560 Stiffness of unspecified joint, not elsewhere classified: Secondary | ICD-10-CM | POA: Diagnosis not present

## 2020-11-21 DIAGNOSIS — E1169 Type 2 diabetes mellitus with other specified complication: Secondary | ICD-10-CM | POA: Diagnosis not present

## 2020-11-21 DIAGNOSIS — D649 Anemia, unspecified: Secondary | ICD-10-CM | POA: Diagnosis not present

## 2020-11-21 DIAGNOSIS — E785 Hyperlipidemia, unspecified: Secondary | ICD-10-CM | POA: Diagnosis not present

## 2020-11-21 DIAGNOSIS — I2510 Atherosclerotic heart disease of native coronary artery without angina pectoris: Secondary | ICD-10-CM | POA: Diagnosis not present

## 2020-11-21 DIAGNOSIS — L309 Dermatitis, unspecified: Secondary | ICD-10-CM | POA: Diagnosis not present

## 2020-11-21 DIAGNOSIS — J309 Allergic rhinitis, unspecified: Secondary | ICD-10-CM | POA: Diagnosis not present

## 2020-11-21 DIAGNOSIS — R293 Abnormal posture: Secondary | ICD-10-CM | POA: Diagnosis not present

## 2020-11-21 DIAGNOSIS — N529 Male erectile dysfunction, unspecified: Secondary | ICD-10-CM | POA: Diagnosis not present

## 2020-11-21 DIAGNOSIS — Z6841 Body Mass Index (BMI) 40.0 and over, adult: Secondary | ICD-10-CM | POA: Diagnosis not present

## 2020-11-21 DIAGNOSIS — N1832 Chronic kidney disease, stage 3b: Secondary | ICD-10-CM | POA: Diagnosis not present

## 2020-11-21 DIAGNOSIS — M6281 Muscle weakness (generalized): Secondary | ICD-10-CM | POA: Diagnosis not present

## 2020-11-21 DIAGNOSIS — F3341 Major depressive disorder, recurrent, in partial remission: Secondary | ICD-10-CM | POA: Diagnosis not present

## 2020-11-21 DIAGNOSIS — M545 Low back pain, unspecified: Secondary | ICD-10-CM | POA: Diagnosis not present

## 2020-11-22 ENCOUNTER — Ambulatory Visit
Admission: RE | Admit: 2020-11-22 | Discharge: 2020-11-22 | Disposition: A | Discharge status: Home / Self Care | Payer: Medicare Other | Source: Ambulatory Visit | Attending: Internal Medicine | Admitting: Internal Medicine

## 2020-11-22 ENCOUNTER — Other Ambulatory Visit: Payer: Self-pay

## 2020-11-22 ENCOUNTER — Telehealth: Payer: Self-pay

## 2020-11-22 DIAGNOSIS — N1832 Chronic kidney disease, stage 3b: Secondary | ICD-10-CM

## 2020-11-22 DIAGNOSIS — N179 Acute kidney failure, unspecified: Secondary | ICD-10-CM

## 2020-11-22 DIAGNOSIS — N181 Chronic kidney disease, stage 1: Secondary | ICD-10-CM | POA: Diagnosis not present

## 2020-11-22 NOTE — Telephone Encounter (Signed)
Prior Auth for [employer]. Status is pending, confirmation number: [id number]

## 2020-11-23 ENCOUNTER — Telehealth: Payer: Self-pay | Admitting: Cardiology

## 2020-11-23 DIAGNOSIS — M545 Low back pain, unspecified: Secondary | ICD-10-CM | POA: Diagnosis not present

## 2020-11-23 DIAGNOSIS — M2560 Stiffness of unspecified joint, not elsewhere classified: Secondary | ICD-10-CM | POA: Diagnosis not present

## 2020-11-23 DIAGNOSIS — R293 Abnormal posture: Secondary | ICD-10-CM | POA: Diagnosis not present

## 2020-11-23 DIAGNOSIS — E785 Hyperlipidemia, unspecified: Secondary | ICD-10-CM

## 2020-11-23 DIAGNOSIS — M6281 Muscle weakness (generalized): Secondary | ICD-10-CM | POA: Diagnosis not present

## 2020-11-23 NOTE — Telephone Encounter (Signed)
   Pt c/o medication issue:  1. Name of Medication: Nexletol  2. How are you currently taking this medication (dosage and times per day)?   3. Are you having a reaction (difficulty breathing--STAT)?   4. What is your medication issue? Pt said after the insurance approved this med, its still going to cost him $800. He said he cant afford that and if Dr. Bettina Gavia can prescribed something cheaper instead

## 2020-11-24 ENCOUNTER — Other Ambulatory Visit: Payer: Self-pay | Admitting: Internal Medicine

## 2020-11-24 DIAGNOSIS — I129 Hypertensive chronic kidney disease with stage 1 through stage 4 chronic kidney disease, or unspecified chronic kidney disease: Secondary | ICD-10-CM

## 2020-11-24 DIAGNOSIS — N179 Acute kidney failure, unspecified: Secondary | ICD-10-CM

## 2020-11-24 DIAGNOSIS — N1832 Chronic kidney disease, stage 3b: Secondary | ICD-10-CM

## 2020-11-24 NOTE — Telephone Encounter (Signed)
Left message on patients voicemail to please return our call.   

## 2020-11-29 ENCOUNTER — Telehealth: Payer: Self-pay

## 2020-11-29 NOTE — Telephone Encounter (Signed)
ONALD Anastasi KeyCaro Laroche - PA Case ID: [id number] - Rx #[id number] Need help? Call us at [phone] Outcome Approved on August 18 PA Case: [id number], Status: Approved, Coverage Starts on: 11/22/2020 12:00:00 AM, Coverage Ends on: 04/07/2021 12:00:00 AM.   Called patient regarding approval of Nexlizet who states that it was going to still cost him around $800.00 for 90 day supply even with the approval and that he can not afford the cost. Told patient that I would make Dr Bettina Gavia aware for further instructions. Patient voices understanding and agreed. I did contact his pharmacy to verify the cost for patient after his insurance was told that a 30 day supply after patients insurance would be $393.00.

## 2020-11-30 DIAGNOSIS — D649 Anemia, unspecified: Secondary | ICD-10-CM | POA: Diagnosis not present

## 2020-11-30 DIAGNOSIS — J309 Allergic rhinitis, unspecified: Secondary | ICD-10-CM | POA: Diagnosis not present

## 2020-11-30 DIAGNOSIS — I10 Essential (primary) hypertension: Secondary | ICD-10-CM | POA: Diagnosis not present

## 2020-11-30 DIAGNOSIS — I2510 Atherosclerotic heart disease of native coronary artery without angina pectoris: Secondary | ICD-10-CM | POA: Diagnosis not present

## 2020-11-30 DIAGNOSIS — H9313 Tinnitus, bilateral: Secondary | ICD-10-CM | POA: Diagnosis not present

## 2020-11-30 DIAGNOSIS — N529 Male erectile dysfunction, unspecified: Secondary | ICD-10-CM | POA: Diagnosis not present

## 2020-11-30 DIAGNOSIS — L309 Dermatitis, unspecified: Secondary | ICD-10-CM | POA: Diagnosis not present

## 2020-11-30 DIAGNOSIS — M545 Low back pain, unspecified: Secondary | ICD-10-CM | POA: Diagnosis not present

## 2020-11-30 DIAGNOSIS — F3341 Major depressive disorder, recurrent, in partial remission: Secondary | ICD-10-CM | POA: Diagnosis not present

## 2020-11-30 DIAGNOSIS — E1169 Type 2 diabetes mellitus with other specified complication: Secondary | ICD-10-CM | POA: Diagnosis not present

## 2020-11-30 DIAGNOSIS — N1832 Chronic kidney disease, stage 3b: Secondary | ICD-10-CM | POA: Diagnosis not present

## 2020-12-04 NOTE — Addendum Note (Signed)
Addended by: Resa Miner I on: 12/04/2020 09:08 AM   Modules accepted: Orders

## 2020-12-04 NOTE — Telephone Encounter (Signed)
Spoke to the patient just now and went over Dr. Joya Gaskins recommendations with him. He verbalizes understanding and thanks me for the call back.

## 2020-12-05 DIAGNOSIS — M545 Low back pain, unspecified: Secondary | ICD-10-CM | POA: Diagnosis not present

## 2020-12-05 DIAGNOSIS — R293 Abnormal posture: Secondary | ICD-10-CM | POA: Diagnosis not present

## 2020-12-05 DIAGNOSIS — M6281 Muscle weakness (generalized): Secondary | ICD-10-CM | POA: Diagnosis not present

## 2020-12-05 DIAGNOSIS — M2560 Stiffness of unspecified joint, not elsewhere classified: Secondary | ICD-10-CM | POA: Diagnosis not present

## 2020-12-06 ENCOUNTER — Ambulatory Visit (INDEPENDENT_AMBULATORY_CARE_PROVIDER_SITE_OTHER): Payer: Medicare Other | Admitting: Pharmacist Clinician (PhC)/ Clinical Pharmacy Specialist

## 2020-12-06 ENCOUNTER — Other Ambulatory Visit: Payer: Self-pay

## 2020-12-06 DIAGNOSIS — E785 Hyperlipidemia, unspecified: Secondary | ICD-10-CM | POA: Diagnosis not present

## 2020-12-06 NOTE — Assessment & Plan Note (Signed)
Patient with elevated LDL cholesterol, unable to tolerate statin drugs.  He is on Medicare and has a plan with $480 up front deductible for branded medications.  PA approved for Nexlizet, however he is unable to afford that deductible.  Unfortunately the manufacturer does not have any patient assistance programs to help with this.  Reviewed the option of Repatha, as he can apply to [place] directly due to financial constraints.  Patient agreeable to this, showed him how to use pen in office and explained side effects, mechanism and potential drop in cholesterol.  He will fill out the paperwork for assistance and bring back in the next week or so and we will hopefully get approval within a few weeks.

## 2020-12-06 NOTE — Patient Instructions (Addendum)
Your Results:             Your most recent labs Goal  Total Cholesterol 257 < 200  Triglycerides 140 < 150  HDL (happy/good cholesterol) 16 > 40  LDL (lousy/bad cholesterol 156 < 70     Medication changes:  We will start the process to get Repatha covered by your insurance.  You will do an injection once every 14 days.    Please fill out the patient assistance paperwork and mail it back ASAP.       Thank you for choosing [facility]
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
## 2020-12-07 DIAGNOSIS — R293 Abnormal posture: Secondary | ICD-10-CM | POA: Diagnosis not present

## 2020-12-07 DIAGNOSIS — M5442 Lumbago with sciatica, left side: Secondary | ICD-10-CM | POA: Diagnosis not present

## 2020-12-07 DIAGNOSIS — M6281 Muscle weakness (generalized): Secondary | ICD-10-CM | POA: Diagnosis not present

## 2020-12-07 DIAGNOSIS — M2560 Stiffness of unspecified joint, not elsewhere classified: Secondary | ICD-10-CM | POA: Diagnosis not present

## 2020-12-07 DIAGNOSIS — M545 Low back pain, unspecified: Secondary | ICD-10-CM | POA: Diagnosis not present

## 2020-12-12 DIAGNOSIS — M2560 Stiffness of unspecified joint, not elsewhere classified: Secondary | ICD-10-CM | POA: Diagnosis not present

## 2020-12-12 DIAGNOSIS — M545 Low back pain, unspecified: Secondary | ICD-10-CM | POA: Diagnosis not present

## 2020-12-12 DIAGNOSIS — R293 Abnormal posture: Secondary | ICD-10-CM | POA: Diagnosis not present

## 2020-12-12 DIAGNOSIS — M6281 Muscle weakness (generalized): Secondary | ICD-10-CM | POA: Diagnosis not present

## 2020-12-19 ENCOUNTER — Telehealth: Payer: Self-pay | Admitting: Cardiology

## 2020-12-19 DIAGNOSIS — M6281 Muscle weakness (generalized): Secondary | ICD-10-CM | POA: Diagnosis not present

## 2020-12-19 DIAGNOSIS — E875 Hyperkalemia: Secondary | ICD-10-CM | POA: Diagnosis not present

## 2020-12-19 DIAGNOSIS — R293 Abnormal posture: Secondary | ICD-10-CM | POA: Diagnosis not present

## 2020-12-19 DIAGNOSIS — N1832 Chronic kidney disease, stage 3b: Secondary | ICD-10-CM | POA: Diagnosis not present

## 2020-12-19 DIAGNOSIS — M2560 Stiffness of unspecified joint, not elsewhere classified: Secondary | ICD-10-CM | POA: Diagnosis not present

## 2020-12-19 DIAGNOSIS — E1122 Type 2 diabetes mellitus with diabetic chronic kidney disease: Secondary | ICD-10-CM | POA: Diagnosis not present

## 2020-12-19 DIAGNOSIS — E785 Hyperlipidemia, unspecified: Secondary | ICD-10-CM | POA: Diagnosis not present

## 2020-12-19 DIAGNOSIS — M545 Low back pain, unspecified: Secondary | ICD-10-CM | POA: Diagnosis not present

## 2020-12-19 DIAGNOSIS — I129 Hypertensive chronic kidney disease with stage 1 through stage 4 chronic kidney disease, or unspecified chronic kidney disease: Secondary | ICD-10-CM | POA: Diagnosis not present

## 2020-12-19 DIAGNOSIS — N179 Acute kidney failure, unspecified: Secondary | ICD-10-CM | POA: Diagnosis not present

## 2020-12-19 NOTE — Telephone Encounter (Signed)
   Pt c/o medication issue:  1. Name of Medication: Nexlizet  2. How are you currently taking this medication (dosage and times per day)?   3. Are you having a reaction (difficulty breathing--STAT)?   4. What is your medication issue? Pt would like to f/u if he got approve for pt assistance

## 2020-12-20 NOTE — Telephone Encounter (Signed)
Called and spoke w/pt and stated that they were approved for the repatha to get it free from the manufacturer for the remainder of the year pt voiced understanding and will call to schedule deliveries

## 2020-12-26 DIAGNOSIS — M2560 Stiffness of unspecified joint, not elsewhere classified: Secondary | ICD-10-CM | POA: Diagnosis not present

## 2020-12-26 DIAGNOSIS — M545 Low back pain, unspecified: Secondary | ICD-10-CM | POA: Diagnosis not present

## 2020-12-26 DIAGNOSIS — R293 Abnormal posture: Secondary | ICD-10-CM | POA: Diagnosis not present

## 2020-12-26 DIAGNOSIS — M6281 Muscle weakness (generalized): Secondary | ICD-10-CM | POA: Diagnosis not present

## 2021-01-02 DIAGNOSIS — M2560 Stiffness of unspecified joint, not elsewhere classified: Secondary | ICD-10-CM | POA: Diagnosis not present

## 2021-01-02 DIAGNOSIS — R293 Abnormal posture: Secondary | ICD-10-CM | POA: Diagnosis not present

## 2021-01-02 DIAGNOSIS — M6281 Muscle weakness (generalized): Secondary | ICD-10-CM | POA: Diagnosis not present

## 2021-01-02 DIAGNOSIS — M545 Low back pain, unspecified: Secondary | ICD-10-CM | POA: Diagnosis not present

## 2021-01-04 DIAGNOSIS — M6281 Muscle weakness (generalized): Secondary | ICD-10-CM | POA: Diagnosis not present

## 2021-01-04 DIAGNOSIS — M2560 Stiffness of unspecified joint, not elsewhere classified: Secondary | ICD-10-CM | POA: Diagnosis not present

## 2021-01-04 DIAGNOSIS — M545 Low back pain, unspecified: Secondary | ICD-10-CM | POA: Diagnosis not present

## 2021-01-04 DIAGNOSIS — R293 Abnormal posture: Secondary | ICD-10-CM | POA: Diagnosis not present

## 2021-01-08 DIAGNOSIS — F3341 Major depressive disorder, recurrent, in partial remission: Secondary | ICD-10-CM | POA: Diagnosis not present

## 2021-01-08 DIAGNOSIS — M545 Low back pain, unspecified: Secondary | ICD-10-CM | POA: Diagnosis not present

## 2021-01-08 DIAGNOSIS — H9313 Tinnitus, bilateral: Secondary | ICD-10-CM | POA: Diagnosis not present

## 2021-01-08 DIAGNOSIS — D649 Anemia, unspecified: Secondary | ICD-10-CM | POA: Diagnosis not present

## 2021-01-08 DIAGNOSIS — J309 Allergic rhinitis, unspecified: Secondary | ICD-10-CM | POA: Diagnosis not present

## 2021-01-08 DIAGNOSIS — N529 Male erectile dysfunction, unspecified: Secondary | ICD-10-CM | POA: Diagnosis not present

## 2021-01-08 DIAGNOSIS — L309 Dermatitis, unspecified: Secondary | ICD-10-CM | POA: Diagnosis not present

## 2021-01-08 DIAGNOSIS — E1169 Type 2 diabetes mellitus with other specified complication: Secondary | ICD-10-CM | POA: Diagnosis not present

## 2021-01-08 DIAGNOSIS — N1832 Chronic kidney disease, stage 3b: Secondary | ICD-10-CM | POA: Diagnosis not present

## 2021-01-08 DIAGNOSIS — I10 Essential (primary) hypertension: Secondary | ICD-10-CM | POA: Diagnosis not present

## 2021-01-08 DIAGNOSIS — I2510 Atherosclerotic heart disease of native coronary artery without angina pectoris: Secondary | ICD-10-CM | POA: Diagnosis not present

## 2021-01-09 DIAGNOSIS — M6281 Muscle weakness (generalized): Secondary | ICD-10-CM | POA: Diagnosis not present

## 2021-01-09 DIAGNOSIS — M2560 Stiffness of unspecified joint, not elsewhere classified: Secondary | ICD-10-CM | POA: Diagnosis not present

## 2021-01-09 DIAGNOSIS — R293 Abnormal posture: Secondary | ICD-10-CM | POA: Diagnosis not present

## 2021-01-09 DIAGNOSIS — M545 Low back pain, unspecified: Secondary | ICD-10-CM | POA: Diagnosis not present

## 2021-01-15 DIAGNOSIS — H9313 Tinnitus, bilateral: Secondary | ICD-10-CM | POA: Diagnosis not present

## 2021-01-15 DIAGNOSIS — N529 Male erectile dysfunction, unspecified: Secondary | ICD-10-CM | POA: Diagnosis not present

## 2021-01-15 DIAGNOSIS — D649 Anemia, unspecified: Secondary | ICD-10-CM | POA: Diagnosis not present

## 2021-01-15 DIAGNOSIS — I2510 Atherosclerotic heart disease of native coronary artery without angina pectoris: Secondary | ICD-10-CM | POA: Diagnosis not present

## 2021-01-15 DIAGNOSIS — L309 Dermatitis, unspecified: Secondary | ICD-10-CM | POA: Diagnosis not present

## 2021-01-15 DIAGNOSIS — N1832 Chronic kidney disease, stage 3b: Secondary | ICD-10-CM | POA: Diagnosis not present

## 2021-01-15 DIAGNOSIS — I10 Essential (primary) hypertension: Secondary | ICD-10-CM | POA: Diagnosis not present

## 2021-01-15 DIAGNOSIS — F3341 Major depressive disorder, recurrent, in partial remission: Secondary | ICD-10-CM | POA: Diagnosis not present

## 2021-01-15 DIAGNOSIS — Z23 Encounter for immunization: Secondary | ICD-10-CM | POA: Diagnosis not present

## 2021-01-15 DIAGNOSIS — J309 Allergic rhinitis, unspecified: Secondary | ICD-10-CM | POA: Diagnosis not present

## 2021-01-15 DIAGNOSIS — E1169 Type 2 diabetes mellitus with other specified complication: Secondary | ICD-10-CM | POA: Diagnosis not present

## 2021-01-16 DIAGNOSIS — M545 Low back pain, unspecified: Secondary | ICD-10-CM | POA: Diagnosis not present

## 2021-01-16 DIAGNOSIS — R293 Abnormal posture: Secondary | ICD-10-CM | POA: Diagnosis not present

## 2021-01-16 DIAGNOSIS — M2560 Stiffness of unspecified joint, not elsewhere classified: Secondary | ICD-10-CM | POA: Diagnosis not present

## 2021-01-16 DIAGNOSIS — M6281 Muscle weakness (generalized): Secondary | ICD-10-CM | POA: Diagnosis not present

## 2021-01-18 DIAGNOSIS — M2560 Stiffness of unspecified joint, not elsewhere classified: Secondary | ICD-10-CM | POA: Diagnosis not present

## 2021-01-18 DIAGNOSIS — M6281 Muscle weakness (generalized): Secondary | ICD-10-CM | POA: Diagnosis not present

## 2021-01-18 DIAGNOSIS — M545 Low back pain, unspecified: Secondary | ICD-10-CM | POA: Diagnosis not present

## 2021-01-18 DIAGNOSIS — R293 Abnormal posture: Secondary | ICD-10-CM | POA: Diagnosis not present

## 2021-01-23 DIAGNOSIS — M6281 Muscle weakness (generalized): Secondary | ICD-10-CM | POA: Diagnosis not present

## 2021-01-23 DIAGNOSIS — R293 Abnormal posture: Secondary | ICD-10-CM | POA: Diagnosis not present

## 2021-01-23 DIAGNOSIS — M2560 Stiffness of unspecified joint, not elsewhere classified: Secondary | ICD-10-CM | POA: Diagnosis not present

## 2021-01-23 DIAGNOSIS — M545 Low back pain, unspecified: Secondary | ICD-10-CM | POA: Diagnosis not present

## 2021-01-29 DIAGNOSIS — I2510 Atherosclerotic heart disease of native coronary artery without angina pectoris: Secondary | ICD-10-CM | POA: Diagnosis not present

## 2021-01-29 DIAGNOSIS — L309 Dermatitis, unspecified: Secondary | ICD-10-CM | POA: Diagnosis not present

## 2021-01-29 DIAGNOSIS — I10 Essential (primary) hypertension: Secondary | ICD-10-CM | POA: Diagnosis not present

## 2021-01-29 DIAGNOSIS — N529 Male erectile dysfunction, unspecified: Secondary | ICD-10-CM | POA: Diagnosis not present

## 2021-01-29 DIAGNOSIS — F3341 Major depressive disorder, recurrent, in partial remission: Secondary | ICD-10-CM | POA: Diagnosis not present

## 2021-01-29 DIAGNOSIS — N1832 Chronic kidney disease, stage 3b: Secondary | ICD-10-CM | POA: Diagnosis not present

## 2021-01-29 DIAGNOSIS — J309 Allergic rhinitis, unspecified: Secondary | ICD-10-CM | POA: Diagnosis not present

## 2021-01-29 DIAGNOSIS — H9313 Tinnitus, bilateral: Secondary | ICD-10-CM | POA: Diagnosis not present

## 2021-01-29 DIAGNOSIS — D649 Anemia, unspecified: Secondary | ICD-10-CM | POA: Diagnosis not present

## 2021-01-29 DIAGNOSIS — E1169 Type 2 diabetes mellitus with other specified complication: Secondary | ICD-10-CM | POA: Diagnosis not present

## 2021-01-29 DIAGNOSIS — M545 Low back pain, unspecified: Secondary | ICD-10-CM | POA: Diagnosis not present

## 2021-01-30 DIAGNOSIS — M545 Low back pain, unspecified: Secondary | ICD-10-CM | POA: Diagnosis not present

## 2021-01-30 DIAGNOSIS — M6281 Muscle weakness (generalized): Secondary | ICD-10-CM | POA: Diagnosis not present

## 2021-01-30 DIAGNOSIS — R293 Abnormal posture: Secondary | ICD-10-CM | POA: Diagnosis not present

## 2021-01-30 DIAGNOSIS — M2560 Stiffness of unspecified joint, not elsewhere classified: Secondary | ICD-10-CM | POA: Diagnosis not present

## 2021-01-31 DIAGNOSIS — I129 Hypertensive chronic kidney disease with stage 1 through stage 4 chronic kidney disease, or unspecified chronic kidney disease: Secondary | ICD-10-CM | POA: Diagnosis not present

## 2021-01-31 DIAGNOSIS — E785 Hyperlipidemia, unspecified: Secondary | ICD-10-CM | POA: Diagnosis not present

## 2021-01-31 DIAGNOSIS — E1122 Type 2 diabetes mellitus with diabetic chronic kidney disease: Secondary | ICD-10-CM | POA: Diagnosis not present

## 2021-01-31 DIAGNOSIS — N179 Acute kidney failure, unspecified: Secondary | ICD-10-CM | POA: Diagnosis not present

## 2021-01-31 DIAGNOSIS — R351 Nocturia: Secondary | ICD-10-CM | POA: Diagnosis not present

## 2021-01-31 DIAGNOSIS — E875 Hyperkalemia: Secondary | ICD-10-CM | POA: Diagnosis not present

## 2021-01-31 DIAGNOSIS — N1832 Chronic kidney disease, stage 3b: Secondary | ICD-10-CM | POA: Diagnosis not present

## 2021-02-01 DIAGNOSIS — M6281 Muscle weakness (generalized): Secondary | ICD-10-CM | POA: Diagnosis not present

## 2021-02-01 DIAGNOSIS — M2560 Stiffness of unspecified joint, not elsewhere classified: Secondary | ICD-10-CM | POA: Diagnosis not present

## 2021-02-01 DIAGNOSIS — R293 Abnormal posture: Secondary | ICD-10-CM | POA: Diagnosis not present

## 2021-02-01 DIAGNOSIS — M545 Low back pain, unspecified: Secondary | ICD-10-CM | POA: Diagnosis not present

## 2021-02-05 DIAGNOSIS — M545 Low back pain, unspecified: Secondary | ICD-10-CM | POA: Diagnosis not present

## 2021-02-05 DIAGNOSIS — M6281 Muscle weakness (generalized): Secondary | ICD-10-CM | POA: Diagnosis not present

## 2021-02-05 DIAGNOSIS — R293 Abnormal posture: Secondary | ICD-10-CM | POA: Diagnosis not present

## 2021-02-05 DIAGNOSIS — M2560 Stiffness of unspecified joint, not elsewhere classified: Secondary | ICD-10-CM | POA: Diagnosis not present

## 2021-02-07 DIAGNOSIS — M5442 Lumbago with sciatica, left side: Secondary | ICD-10-CM | POA: Diagnosis not present

## 2021-02-12 DIAGNOSIS — J309 Allergic rhinitis, unspecified: Secondary | ICD-10-CM | POA: Diagnosis not present

## 2021-02-12 DIAGNOSIS — D649 Anemia, unspecified: Secondary | ICD-10-CM | POA: Diagnosis not present

## 2021-02-12 DIAGNOSIS — I10 Essential (primary) hypertension: Secondary | ICD-10-CM | POA: Diagnosis not present

## 2021-02-12 DIAGNOSIS — L309 Dermatitis, unspecified: Secondary | ICD-10-CM | POA: Diagnosis not present

## 2021-02-12 DIAGNOSIS — F3341 Major depressive disorder, recurrent, in partial remission: Secondary | ICD-10-CM | POA: Diagnosis not present

## 2021-02-12 DIAGNOSIS — N529 Male erectile dysfunction, unspecified: Secondary | ICD-10-CM | POA: Diagnosis not present

## 2021-02-12 DIAGNOSIS — N1832 Chronic kidney disease, stage 3b: Secondary | ICD-10-CM | POA: Diagnosis not present

## 2021-02-12 DIAGNOSIS — H9313 Tinnitus, bilateral: Secondary | ICD-10-CM | POA: Diagnosis not present

## 2021-02-12 DIAGNOSIS — I2510 Atherosclerotic heart disease of native coronary artery without angina pectoris: Secondary | ICD-10-CM | POA: Diagnosis not present

## 2021-02-12 DIAGNOSIS — E1169 Type 2 diabetes mellitus with other specified complication: Secondary | ICD-10-CM | POA: Diagnosis not present

## 2021-02-12 DIAGNOSIS — M545 Low back pain, unspecified: Secondary | ICD-10-CM | POA: Diagnosis not present

## 2021-02-20 DIAGNOSIS — Z6841 Body Mass Index (BMI) 40.0 and over, adult: Secondary | ICD-10-CM | POA: Diagnosis not present

## 2021-02-20 DIAGNOSIS — E669 Obesity, unspecified: Secondary | ICD-10-CM | POA: Diagnosis not present

## 2021-02-20 DIAGNOSIS — Z1331 Encounter for screening for depression: Secondary | ICD-10-CM | POA: Diagnosis not present

## 2021-02-20 DIAGNOSIS — Z0000 Encounter for general adult medical examination without abnormal findings: Secondary | ICD-10-CM | POA: Diagnosis not present

## 2021-02-20 DIAGNOSIS — Z9181 History of falling: Secondary | ICD-10-CM | POA: Diagnosis not present

## 2021-02-20 DIAGNOSIS — Z139 Encounter for screening, unspecified: Secondary | ICD-10-CM | POA: Diagnosis not present

## 2021-02-20 DIAGNOSIS — E785 Hyperlipidemia, unspecified: Secondary | ICD-10-CM | POA: Diagnosis not present

## 2021-03-08 DIAGNOSIS — E1122 Type 2 diabetes mellitus with diabetic chronic kidney disease: Secondary | ICD-10-CM | POA: Diagnosis not present

## 2021-03-08 DIAGNOSIS — I129 Hypertensive chronic kidney disease with stage 1 through stage 4 chronic kidney disease, or unspecified chronic kidney disease: Secondary | ICD-10-CM | POA: Diagnosis not present

## 2021-03-08 DIAGNOSIS — E1169 Type 2 diabetes mellitus with other specified complication: Secondary | ICD-10-CM | POA: Diagnosis not present

## 2021-03-08 DIAGNOSIS — Z6841 Body Mass Index (BMI) 40.0 and over, adult: Secondary | ICD-10-CM | POA: Diagnosis not present

## 2021-03-08 DIAGNOSIS — N1832 Chronic kidney disease, stage 3b: Secondary | ICD-10-CM | POA: Diagnosis not present

## 2021-03-12 DIAGNOSIS — L309 Dermatitis, unspecified: Secondary | ICD-10-CM | POA: Diagnosis not present

## 2021-03-12 DIAGNOSIS — F3341 Major depressive disorder, recurrent, in partial remission: Secondary | ICD-10-CM | POA: Diagnosis not present

## 2021-03-12 DIAGNOSIS — J309 Allergic rhinitis, unspecified: Secondary | ICD-10-CM | POA: Diagnosis not present

## 2021-03-12 DIAGNOSIS — M545 Low back pain, unspecified: Secondary | ICD-10-CM | POA: Diagnosis not present

## 2021-03-12 DIAGNOSIS — N529 Male erectile dysfunction, unspecified: Secondary | ICD-10-CM | POA: Diagnosis not present

## 2021-03-12 DIAGNOSIS — E1169 Type 2 diabetes mellitus with other specified complication: Secondary | ICD-10-CM | POA: Diagnosis not present

## 2021-03-12 DIAGNOSIS — I2510 Atherosclerotic heart disease of native coronary artery without angina pectoris: Secondary | ICD-10-CM | POA: Diagnosis not present

## 2021-03-12 DIAGNOSIS — N1832 Chronic kidney disease, stage 3b: Secondary | ICD-10-CM | POA: Diagnosis not present

## 2021-03-12 DIAGNOSIS — D649 Anemia, unspecified: Secondary | ICD-10-CM | POA: Diagnosis not present

## 2021-03-12 DIAGNOSIS — E785 Hyperlipidemia, unspecified: Secondary | ICD-10-CM | POA: Diagnosis not present

## 2021-03-12 DIAGNOSIS — H9313 Tinnitus, bilateral: Secondary | ICD-10-CM | POA: Diagnosis not present

## 2021-03-19 ENCOUNTER — Telehealth: Payer: Self-pay | Admitting: Cardiology

## 2021-03-19 NOTE — Telephone Encounter (Signed)
Patient called stating he needs a refill on his Evolocumab 140 MG/ML SOAJ, he said he had patient assistance for this.  He said it would be time to renew it.

## 2021-03-20 MED ORDER — EVOLOCUMAB 140 MG/ML ~~LOC~~ SOAJ: 140.0000 mg | SUBCUTANEOUS | 11 refills | Status: DC

## 2021-03-20 NOTE — Telephone Encounter (Signed)
Called and informed the pt that they were approved for the [employer] grant and information was provided.  Hypercholesterolemia - Medicare Access [employer] ID [id number]   PATIENT Mario Green   STATUS  Active   START DATE 02/18/2021   END DATE 02/17/2022   ASSISTANCE TYPE Co-pay   PAID $0.00   PENDING $0.00   BALANCE $2500.00 Pharmacy Card CARD NO. [id number]   CARD STATUS Active   BIN [id number]   PCN [id number]   PC GROUP [id number]   HELP DESK [phone]   PROVIDER PDMI   PROCESSOR PDMI

## 2021-03-21 DIAGNOSIS — N1832 Chronic kidney disease, stage 3b: Secondary | ICD-10-CM | POA: Diagnosis not present

## 2021-03-21 DIAGNOSIS — N529 Male erectile dysfunction, unspecified: Secondary | ICD-10-CM | POA: Diagnosis not present

## 2021-03-21 DIAGNOSIS — E785 Hyperlipidemia, unspecified: Secondary | ICD-10-CM | POA: Diagnosis not present

## 2021-03-21 DIAGNOSIS — M545 Low back pain, unspecified: Secondary | ICD-10-CM | POA: Diagnosis not present

## 2021-03-21 DIAGNOSIS — J309 Allergic rhinitis, unspecified: Secondary | ICD-10-CM | POA: Diagnosis not present

## 2021-03-21 DIAGNOSIS — I2510 Atherosclerotic heart disease of native coronary artery without angina pectoris: Secondary | ICD-10-CM | POA: Diagnosis not present

## 2021-03-21 DIAGNOSIS — D649 Anemia, unspecified: Secondary | ICD-10-CM | POA: Diagnosis not present

## 2021-03-21 DIAGNOSIS — F3341 Major depressive disorder, recurrent, in partial remission: Secondary | ICD-10-CM | POA: Diagnosis not present

## 2021-03-21 DIAGNOSIS — E1169 Type 2 diabetes mellitus with other specified complication: Secondary | ICD-10-CM | POA: Diagnosis not present

## 2021-03-21 DIAGNOSIS — H9313 Tinnitus, bilateral: Secondary | ICD-10-CM | POA: Diagnosis not present

## 2021-03-21 DIAGNOSIS — L309 Dermatitis, unspecified: Secondary | ICD-10-CM | POA: Diagnosis not present

## 2021-03-22 DIAGNOSIS — E1169 Type 2 diabetes mellitus with other specified complication: Secondary | ICD-10-CM | POA: Diagnosis not present

## 2021-03-22 DIAGNOSIS — E1122 Type 2 diabetes mellitus with diabetic chronic kidney disease: Secondary | ICD-10-CM | POA: Diagnosis not present

## 2021-03-22 DIAGNOSIS — Z6841 Body Mass Index (BMI) 40.0 and over, adult: Secondary | ICD-10-CM | POA: Diagnosis not present

## 2021-03-22 DIAGNOSIS — N1832 Chronic kidney disease, stage 3b: Secondary | ICD-10-CM | POA: Diagnosis not present

## 2021-03-22 DIAGNOSIS — I129 Hypertensive chronic kidney disease with stage 1 through stage 4 chronic kidney disease, or unspecified chronic kidney disease: Secondary | ICD-10-CM | POA: Diagnosis not present

## 2021-04-04 DIAGNOSIS — E1122 Type 2 diabetes mellitus with diabetic chronic kidney disease: Secondary | ICD-10-CM | POA: Diagnosis not present

## 2021-04-04 DIAGNOSIS — I129 Hypertensive chronic kidney disease with stage 1 through stage 4 chronic kidney disease, or unspecified chronic kidney disease: Secondary | ICD-10-CM | POA: Diagnosis not present

## 2021-04-04 DIAGNOSIS — Z6841 Body Mass Index (BMI) 40.0 and over, adult: Secondary | ICD-10-CM | POA: Diagnosis not present

## 2021-04-04 DIAGNOSIS — E1169 Type 2 diabetes mellitus with other specified complication: Secondary | ICD-10-CM | POA: Diagnosis not present

## 2021-04-04 DIAGNOSIS — N1832 Chronic kidney disease, stage 3b: Secondary | ICD-10-CM | POA: Diagnosis not present

## 2021-04-13 DIAGNOSIS — H9313 Tinnitus, bilateral: Secondary | ICD-10-CM | POA: Diagnosis not present

## 2021-04-13 DIAGNOSIS — F3341 Major depressive disorder, recurrent, in partial remission: Secondary | ICD-10-CM | POA: Diagnosis not present

## 2021-04-13 DIAGNOSIS — D649 Anemia, unspecified: Secondary | ICD-10-CM | POA: Diagnosis not present

## 2021-04-13 DIAGNOSIS — E1169 Type 2 diabetes mellitus with other specified complication: Secondary | ICD-10-CM | POA: Diagnosis not present

## 2021-04-13 DIAGNOSIS — N184 Chronic kidney disease, stage 4 (severe): Secondary | ICD-10-CM | POA: Diagnosis not present

## 2021-04-13 DIAGNOSIS — I2510 Atherosclerotic heart disease of native coronary artery without angina pectoris: Secondary | ICD-10-CM | POA: Diagnosis not present

## 2021-04-13 DIAGNOSIS — J309 Allergic rhinitis, unspecified: Secondary | ICD-10-CM | POA: Diagnosis not present

## 2021-04-13 DIAGNOSIS — E785 Hyperlipidemia, unspecified: Secondary | ICD-10-CM | POA: Diagnosis not present

## 2021-04-13 DIAGNOSIS — R7989 Other specified abnormal findings of blood chemistry: Secondary | ICD-10-CM | POA: Diagnosis not present

## 2021-04-27 DIAGNOSIS — J309 Allergic rhinitis, unspecified: Secondary | ICD-10-CM | POA: Diagnosis not present

## 2021-04-27 DIAGNOSIS — F3341 Major depressive disorder, recurrent, in partial remission: Secondary | ICD-10-CM | POA: Diagnosis not present

## 2021-04-27 DIAGNOSIS — N184 Chronic kidney disease, stage 4 (severe): Secondary | ICD-10-CM | POA: Diagnosis not present

## 2021-04-27 DIAGNOSIS — E1169 Type 2 diabetes mellitus with other specified complication: Secondary | ICD-10-CM | POA: Diagnosis not present

## 2021-04-27 DIAGNOSIS — I2510 Atherosclerotic heart disease of native coronary artery without angina pectoris: Secondary | ICD-10-CM | POA: Diagnosis not present

## 2021-04-27 DIAGNOSIS — D649 Anemia, unspecified: Secondary | ICD-10-CM | POA: Diagnosis not present

## 2021-04-27 DIAGNOSIS — R7989 Other specified abnormal findings of blood chemistry: Secondary | ICD-10-CM | POA: Diagnosis not present

## 2021-04-27 DIAGNOSIS — H9313 Tinnitus, bilateral: Secondary | ICD-10-CM | POA: Diagnosis not present

## 2021-05-01 DIAGNOSIS — N1832 Chronic kidney disease, stage 3b: Secondary | ICD-10-CM | POA: Diagnosis not present

## 2021-05-01 DIAGNOSIS — I129 Hypertensive chronic kidney disease with stage 1 through stage 4 chronic kidney disease, or unspecified chronic kidney disease: Secondary | ICD-10-CM | POA: Diagnosis not present

## 2021-05-01 DIAGNOSIS — R351 Nocturia: Secondary | ICD-10-CM | POA: Diagnosis not present

## 2021-05-01 DIAGNOSIS — E785 Hyperlipidemia, unspecified: Secondary | ICD-10-CM | POA: Diagnosis not present

## 2021-05-01 DIAGNOSIS — M549 Dorsalgia, unspecified: Secondary | ICD-10-CM | POA: Diagnosis not present

## 2021-05-01 DIAGNOSIS — E875 Hyperkalemia: Secondary | ICD-10-CM | POA: Diagnosis not present

## 2021-05-01 DIAGNOSIS — E1122 Type 2 diabetes mellitus with diabetic chronic kidney disease: Secondary | ICD-10-CM | POA: Diagnosis not present

## 2021-05-08 DIAGNOSIS — I10 Essential (primary) hypertension: Secondary | ICD-10-CM | POA: Diagnosis not present

## 2021-05-08 DIAGNOSIS — N184 Chronic kidney disease, stage 4 (severe): Secondary | ICD-10-CM | POA: Diagnosis not present

## 2021-05-08 DIAGNOSIS — E1169 Type 2 diabetes mellitus with other specified complication: Secondary | ICD-10-CM | POA: Diagnosis not present

## 2021-05-13 NOTE — Progress Notes (Signed)
Cardiology Office Note:    Date:  05/14/2021   ID:  Mario Green, DOB 10-Dec-1955, MRN [id number]  PCP:  Cher Nakai, MD  Cardiologist:  Shirlee More, MD    Referring MD: Cher Nakai, MD    ASSESSMENT:    1. Coronary artery disease of native artery of native heart with stable angina pectoris ([place])   2. CKD (chronic kidney disease), symptom management only, stage 3 (moderate) (HCC)   3. Mixed hyperlipidemia   4. Type 2 diabetes mellitus without complication, unspecified whether long term insulin use (HCC)   5. Stenosis of carotid artery, unspecified laterality    PLAN:    In order of problems listed above:  Stable CAD following surgical revascularization New York Heart Association class I having no angina continues medical therapy including aspirin calcium channel blocker clopidogrel and lipid-lowering with Repatha.  Bempedoic acid is listed as medications he is never taken it. Stable followed by [facility] Lipids are ideal for renal withdrawal fenofibrate use over-the-counter fish oil if result is not optimal we can transition Lovaza and/or icosapent ethyl Stable managed by his PCP A1c is above target Check carotid duplex   Next appointment: 6 months   Medication Adjustments/Labs and Tests Ordered: Current medicines are reviewed at length with the patient today.  Concerns regarding medicines are outlined above.  No orders of the defined types were placed in this encounter.  No orders of the defined types were placed in this encounter.   Chief complaint follow-up CAD   History of Present Illness:    Mario Green is a 66 y.o. male with a hx of CAD and CABG 2016 stage III CKD type 2 diabetes and hyperlipidemia last seen 11/10/2020. Compliance with diet, lifestyle and medications: Yes  He follows with nephrology.  He tells me his CAD is stable He inquires if he still needs fenofibrate his lipids are optimal Withdrawal some concerns with safety and transition to  over-the-counter visual No angina dizziness shortness of breath palpitations or syncope Tells me in the past he was told he had carotid stenosis and advised follow-up I reviewed in epic and I cannot find any documentation     He had previously been seen by my partner Dr. Agustin Cree [facility] regional cardiology last 05/06/2016. He had coronary angiography performed 04/24/2015 showing patent grafts and no recommendation for further intervention.  I will cut-and-paste the record from care everywhere to the chart..   1. Severe LAD/RCA lesions  2. Cir with mild disease  3. All grafts are widely patent (LIMA to LAD, SVG to RCA, SVG to Diag )  4. LVEDP 12, ventriculogram was not performed due to renal insuff. Normal by  recent non-invasive test.  Diagnostic Procedure Recommendations  Medical treatment.   Signatures   Electronically signed by Rozann Lesches, MD(Diagnostic Physician) on   04/24/2015 12:15   Angiographic findings   Cardiac Arteries and Lesion Findings  LMCA: Normal.  LAD:    Lesion on Prox LAD: 95% stenosis.    Lesion on Mid LAD: 95% stenosis.  LCx:    Lesion on Prox CX: 20% stenosis.    Lesion on Mid CX: 40% stenosis.    Lesion on 1st Ob Marg: 30% stenosis.  RCA:    Lesion on Prox RCA: Ostial.100% stenosis.  Cardiac Grafts   -  There is a Vein graft that originates at the Aorta Left and attaches to      the 1st Diag.patent   -  There is a Vein graft  that originates at the Aorta Right and attaches to      the Mid RCA.patnet   -  There is a LIMA graft that originates at the LIMA and attaches to the Mid      LAD.patent  Past Medical History:  Diagnosis Date   Allergic rhinitis    Anemia    Benign essential hypertension    CAD (coronary artery disease)    CKD (chronic kidney disease), symptom management only, stage 3 (moderate) (HCC)    Dyslipidemia 03/24/2015   ED (erectile dysfunction)    Hyperlipidemia    Morbid obesity due to excess calories ([place]) 03/24/2015    Obstructive sleep apnea 03/24/2015   Renal insufficiency    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 LIMA to LAD, SVG to diagonal 1, SVG to PDA   Tinnitus, bilateral    Type 2 diabetes mellitus without complication ([place]) 36/64/4034    Past Surgical History:  Procedure Laterality Date   ADENOIDECTOMY     CARDIAC SURGERY     Tripple bypass    Current Medications: Current Meds  Medication Sig   amLODipine (NORVASC) 2.5 MG tablet Take 2.5 mg by mouth at bedtime.   aspirin 81 MG EC tablet Take 81 mg by mouth daily.   Bempedoic Acid-Ezetimibe 180-10 MG TABS Take 1 tablet by mouth daily.   clopidogrel (PLAVIX) 75 MG tablet Take 1 tablet by mouth daily.   Dulaglutide (TRULICITY) 4.5 VQ/2.5ZD SOPN Inject 4.5 mg into the skin once a week.   Evolocumab 140 MG/ML SOAJ Inject 140 mg into the skin every 14 (fourteen) days. Inject 1 pen every 14 days subcutaneously   fenofibrate 160 MG tablet Take 160 mg by mouth daily.   gabapentin (NEURONTIN) 100 MG capsule Take 100 mg by mouth 2 (two) times daily as needed for pain.   gabapentin (NEURONTIN) 300 MG capsule Take 300 mg by mouth at bedtime as needed (PAIN).   glimepiride (AMARYL) 4 MG tablet Take 4 mg by mouth daily.   insulin detemir (LEVEMIR) 100 UNIT/ML FlexPen Inject 18 Units into the skin 2 (two) times daily.   metoprolol tartrate (LOPRESSOR) 100 MG tablet Take 50 mg by mouth daily.   tadalafil (CIALIS) 10 MG tablet Take 10 mg by mouth daily as needed for erectile dysfunction.   tamsulosin (FLOMAX) 0.4 MG CAPS capsule Take 0.4 mg by mouth at bedtime.     Allergies:   Patient has no known allergies.   Social History   Socioeconomic History   Marital status: Single    Spouse name: Not on file   Number of children: Not on file   Years of education: Not on file   Highest education level: Not on file  Occupational History   Not on file  Tobacco Use   Smoking  status: Never   Smokeless tobacco: Former    Types: Chew  Substance and Sexual Activity   Alcohol use: Not Currently    Comment: occasional   Drug use: Never   Sexual activity: Not on file  Other Topics Concern   Not on file  Social History Narrative   Not on file   Social Determinants of Health   Financial Resource Strain: Not on file  Food Insecurity: Not on file  Transportation Needs: Not on file  Physical Activity: Not on file  Stress: Not on file  Social Connections: Not on file     Family History:  The patient's family history includes Dementia in his mother; Diabetes in his father; Heart attack in his father. ROS:   Please see the history of present illness.    All other systems reviewed and are negative.  EKGs/Labs/Other Studies Reviewed:    The following studies were reviewed today:    Recent Labs:  04/13/2021: Cholesterol 110 LDL 59 triglycerides 109 HDL 31 A1c elevated 8.0% hemoglobin 12.2 creatinine 3.03 Physical Exam:    VS:  BP 136/82 (BP Location: Left Arm)    Pulse 62    Ht 6' (1.829 m)    Wt (!) 303 lb (137.4 kg)    SpO2 97%    BMI 41.09 kg/m     Wt Readings from Last 3 Encounters:  05/14/21 (!) 303 lb (137.4 kg)  12/06/20 (!) 306 lb 4.8 oz (138.9 kg)  11/10/20 (!) 302 lb (137 kg)     GEN: Obese BMI exceeds 40 well nourished, well developed in no acute distress HEENT: Normal NECK: No JVD; No carotid bruits LYMPHATICS: No lymphadenopathy CARDIAC: Sternum well-healed he has a large keloid RRR, no murmurs, rubs, gallops RESPIRATORY:  Clear to auscultation without rales, wheezing or rhonchi  ABDOMEN: Soft, non-tender, non-distended MUSCULOSKELETAL:  No edema; No deformity  SKIN: Warm and dry NEUROLOGIC:  Alert and oriented x 3 PSYCHIATRIC:  Normal affect    Signed, Shirlee More, MD  05/14/2021 11:36 AM    [place]

## 2021-05-14 ENCOUNTER — Encounter: Payer: Self-pay | Admitting: Cardiology

## 2021-05-14 ENCOUNTER — Ambulatory Visit: Payer: Medicare HMO | Admitting: Cardiology

## 2021-05-14 ENCOUNTER — Other Ambulatory Visit: Payer: Self-pay

## 2021-05-14 VITALS — BP 136/82 | HR 62 | Ht 72.0 in | Wt 303.0 lb

## 2021-05-14 DIAGNOSIS — E782 Mixed hyperlipidemia: Secondary | ICD-10-CM

## 2021-05-14 DIAGNOSIS — I6529 Occlusion and stenosis of unspecified carotid artery: Secondary | ICD-10-CM | POA: Diagnosis not present

## 2021-05-14 DIAGNOSIS — E119 Type 2 diabetes mellitus without complications: Secondary | ICD-10-CM | POA: Diagnosis not present

## 2021-05-14 DIAGNOSIS — I25118 Atherosclerotic heart disease of native coronary artery with other forms of angina pectoris: Secondary | ICD-10-CM | POA: Diagnosis not present

## 2021-05-14 DIAGNOSIS — N183 Chronic kidney disease, stage 3 unspecified: Secondary | ICD-10-CM

## 2021-05-14 NOTE — Patient Instructions (Addendum)
Medication Instructions:  Your physician has recommended you make the following change in your medication:   STOP: Fenofibrate START: Fish Oil Over the counter 2 tabs per day  *If you need a refill on your cardiac medications before your next appointment, please call your pharmacy*   Lab Work: None If you have labs (blood work) drawn today and your tests are completely normal, you will receive your results only by: [place] (if you have MyChart) OR A paper copy in the mail If you have any lab test that is abnormal or we need to change your treatment, we will call you to review the results.   Testing/Procedures: Your physician has requested that you have a carotid duplex. This test is an ultrasound of the carotid arteries in your neck. It looks at blood flow through these arteries that supply the brain with blood. Allow one hour for this exam. There are no restrictions or special instructions.    Follow-Up: At [facility], you and your health needs are our priority.  As part of our continuing mission to provide you with exceptional heart care, we have created designated Provider Care Teams.  These Care Teams include your primary Cardiologist (physician) and Advanced Practice Providers (APPs -  Physician Assistants and Nurse Practitioners) who all work together to provide you with the care you need, when you need it.  We recommend signing up for the patient portal called "MyChart".  Sign up information is provided on this After Visit Summary.  MyChart is used to connect with patients for Virtual Visits (Telemedicine).  Patients are able to view lab/test results, encounter notes, upcoming appointments, etc.  Non-urgent messages can be sent to your provider as well.   To learn more about what you can do with MyChart, go to [email or web address].    Your next appointment:   6 month(s)  The format for your next appointment:   In Person  Provider:   Shirlee More, MD     Other Instructions None

## 2021-05-14 NOTE — Addendum Note (Signed)
Addended by: Edwyna Shell I on: 05/14/2021 12:04 PM   Modules accepted: Orders

## 2021-05-15 ENCOUNTER — Ambulatory Visit (INDEPENDENT_AMBULATORY_CARE_PROVIDER_SITE_OTHER): Payer: Medicare HMO

## 2021-05-15 DIAGNOSIS — I6521 Occlusion and stenosis of right carotid artery: Secondary | ICD-10-CM | POA: Diagnosis not present

## 2021-05-15 DIAGNOSIS — N183 Chronic kidney disease, stage 3 unspecified: Secondary | ICD-10-CM

## 2021-05-15 DIAGNOSIS — E119 Type 2 diabetes mellitus without complications: Secondary | ICD-10-CM

## 2021-05-15 DIAGNOSIS — E782 Mixed hyperlipidemia: Secondary | ICD-10-CM

## 2021-05-15 DIAGNOSIS — I25118 Atherosclerotic heart disease of native coronary artery with other forms of angina pectoris: Secondary | ICD-10-CM

## 2021-05-15 DIAGNOSIS — I6529 Occlusion and stenosis of unspecified carotid artery: Secondary | ICD-10-CM

## 2021-05-16 ENCOUNTER — Telehealth: Payer: Self-pay

## 2021-05-16 DIAGNOSIS — I6521 Occlusion and stenosis of right carotid artery: Secondary | ICD-10-CM

## 2021-05-16 DIAGNOSIS — E785 Hyperlipidemia, unspecified: Secondary | ICD-10-CM

## 2021-05-16 NOTE — Telephone Encounter (Signed)
-----   Message from Richardo Priest, MD sent at 05/16/2021  2:53 PM EST ----- In the office he told me he had a carotid narrowing and asked me to arrange ultrasound follow-up  A bit surprised that he has severe blockage in the right internal carotid artery I think the best course is to have him see the vascular surgeons in [facility] to get an opinion about further evaluation or potentially requiring surgical or stenting treatment

## 2021-05-16 NOTE — Telephone Encounter (Signed)
Called and spoke w/pt stated they needed fasting labs for repatha no appt needed. Pt voiced understanding

## 2021-05-16 NOTE — Telephone Encounter (Signed)
-----   Message from Rollen Sox, [facility] sent at 05/16/2021  9:06 AM EST ----- Regarding: RE: labs yes ----- Message ----- From: Allean Found, CMA Sent: 05/16/2021   8:45 AM EST To: [facility] Subject: labs                                           Permission to order lipid labs for repatha?

## 2021-05-16 NOTE — Telephone Encounter (Signed)
Spoke with patient regarding results and recommendation.  Patient verbalizes understanding and is agreeable to plan of care. Advised patient to call back with any issues or concerns.  

## 2021-05-21 ENCOUNTER — Telehealth: Payer: Self-pay | Admitting: Cardiology

## 2021-05-21 NOTE — Telephone Encounter (Signed)
Left vm for pt to callback 

## 2021-05-21 NOTE — Telephone Encounter (Signed)
Patient states that someone was supposed to call him back in regards to his blockage. Please advise

## 2021-05-22 NOTE — Telephone Encounter (Signed)
Spoke with pt and advised that a referral has been placed and the referring office should call for an appointment. Pt verbalized understanding and had no additional comments.

## 2021-05-23 ENCOUNTER — Encounter: Payer: Self-pay | Admitting: Vascular Surgery

## 2021-05-23 ENCOUNTER — Other Ambulatory Visit: Payer: Self-pay

## 2021-05-23 ENCOUNTER — Ambulatory Visit: Payer: Medicare HMO | Admitting: Vascular Surgery

## 2021-05-23 VITALS — BP 120/70 | HR 71 | Temp 97.9°F | Resp 20 | Ht 72.0 in | Wt 302.0 lb

## 2021-05-23 DIAGNOSIS — I6521 Occlusion and stenosis of right carotid artery: Secondary | ICD-10-CM | POA: Diagnosis not present

## 2021-05-23 NOTE — Progress Notes (Signed)
Patient ID: MANN SKAGGS, male   DOB: 06-06-1955, 66 y.o.   MRN: [id number]  Reason for Consult: New Patient (Initial Visit) and Carotid   Referred by Richardo Priest, MD  Subjective:     HPI:  Mario Green is a 66 y.o. male with significant history of coronary artery disease with previous coronary artery bypass grafting.  He has hyperlipidemia and type 2 diabetes.  He is a lifelong non-smoker.  More recently was found to have carotid stenosis on the right.  He denies any history of stroke, TIA or amaurosis.  No personal or family history of aneurysm disease and he walks without limitations.  He is on Aspirin, Plavix and Repatha given an intolerance to statins.  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 LIMA to LAD, SVG to diagonal 1, SVG to PDA   Tinnitus, bilateral    Type 2 diabetes mellitus without complication ([place]) 93/23/5573   Family History  Problem Relation Age of Onset   Dementia Mother    Diabetes Father    Heart attack Father    Past Surgical History:  Procedure Laterality Date   ADENOIDECTOMY     CARDIAC SURGERY     Tripple bypass    Short Social History:  Social History   Tobacco Use   Smoking status: Never   Smokeless tobacco: Former    Types: Chew  Substance Use Topics   Alcohol use: Not Currently    Comment: occasional    No Known Allergies  Current Outpatient Medications  Medication Sig Dispense Refill   amLODipine (NORVASC) 2.5 MG tablet Take 2.5 mg by  mouth at bedtime.     aspirin 81 MG EC tablet Take 81 mg by mouth daily.     clopidogrel (PLAVIX) 75 MG tablet Take 1 tablet by mouth daily.     Dulaglutide (TRULICITY) 4.5 UK/0.2RK SOPN Inject 4.5 mg into the skin once a week.     Evolocumab 140 MG/ML SOAJ Inject 140 mg into the skin every 14 (fourteen) days. Inject 1 pen every 14 days subcutaneously 2 mL 11   gabapentin (NEURONTIN) 100 MG capsule Take 100 mg by mouth 2 (two) times daily as needed for pain.     gabapentin (NEURONTIN) 300 MG capsule Take 300 mg by mouth at bedtime as needed (PAIN).     glimepiride (AMARYL) 4 MG tablet Take 4 mg by mouth daily.     insulin detemir (LEVEMIR) 100 UNIT/ML FlexPen Inject 18 Units into the skin 2 (two) times daily.     metoprolol tartrate (LOPRESSOR) 100 MG tablet Take 50 mg by mouth daily.     tadalafil (CIALIS) 10 MG tablet Take 10 mg by mouth daily as needed for erectile dysfunction.     tamsulosin (FLOMAX) 0.4 MG CAPS capsule Take 0.4 mg by mouth at bedtime.     No current facility-administered medications for this visit.    Review of Systems  Constitutional:  Constitutional negative. HENT:  HENT negative.  Eyes: Eyes negative.  Respiratory: Respiratory negative.  Cardiovascular: Cardiovascular negative.  GI: Gastrointestinal negative.  Musculoskeletal: Musculoskeletal negative.  Skin: Skin negative.  Neurological: Neurological negative. Hematologic: Hematologic/lymphatic negative.  Psychiatric: Psychiatric negative.       Objective:  Objective   Vitals:   05/23/21 1356 05/23/21 1359  BP: 121/72 120/70  Pulse: 71   Resp: 20   Temp: 97.9 F (36.6 C)   SpO2: 94%   Weight: (!) 302 lb (137 kg)   Height: 6' (1.829 m)    Body mass index is 40.96 kg/m.  Physical Exam HENT:     Head: Normocephalic.     Nose:     Comments: Wearing a mask Eyes:     Pupils: Pupils are equal, round, and reactive to light.  Neck:     Vascular: No carotid bruit.  Cardiovascular:     Rate and  Rhythm: Normal rate.     Pulses: Normal pulses.     Heart sounds: Normal heart sounds.  Pulmonary:     Effort: Pulmonary effort is normal.     Breath sounds: Normal breath sounds.  Abdominal:     General: Abdomen is flat.     Palpations: Abdomen is soft. There is no mass.  Musculoskeletal:        General: Normal range of motion.     Cervical back: Neck supple.     Right lower leg: No edema.     Left lower leg: No edema.  Skin:    General: Skin is warm and dry.     Capillary Refill: Capillary refill takes less than 2 seconds.  Neurological:     General: No focal deficit present.     Mental Status: He is alert.  Psychiatric:        Mood and Affect: Mood normal.        Behavior: Behavior normal.    Data: Right Carotid Findings:  +----------+--------+--------+--------+------------------+------------+              PSV cm/s EDV cm/s Stenosis Plaque Description Comments       +----------+--------+--------+--------+------------------+------------+   CCA Prox   74       9                                                   +----------+--------+--------+--------+------------------+------------+   CCA Distal 70       12                                                  +----------+--------+--------+--------+------------------+------------+   ICA Prox   104      29                calcific                          +----------+--------+--------+--------+------------------+------------+   ICA Mid    394      137      80-99%   calcific           Trickle flow   +----------+--------+--------+--------+------------------+------------+   ICA Distal 124      12                                                  +----------+--------+--------+--------+------------------+------------+  ECA        75       7                                                   +----------+--------+--------+--------+------------------+------------+   +----------+--------+-------+----------------+-------------------+               PSV cm/s EDV cms Describe         Arm Pressure (mmHG)   +----------+--------+-------+----------------+-------------------+   Subclavian 88               Multiphasic, WNL                       +----------+--------+-------+----------------+-------------------+   +---------+--------+--+--------+--+---------+   Vertebral PSV cm/s 58 EDV cm/s 13 Antegrade   +---------+--------+--+--------+--+---------+       Left Carotid Findings:  +----------+--------+--------+--------+------------------+--------+              PSV cm/s EDV cm/s Stenosis Plaque Description Comments   +----------+--------+--------+--------+------------------+--------+   CCA Prox   92       12                                              +----------+--------+--------+--------+------------------+--------+   CCA Distal 80       15                                              +----------+--------+--------+--------+------------------+--------+   ICA Prox   96       22                                              +----------+--------+--------+--------+------------------+--------+   ICA Mid    97       28                                              +----------+--------+--------+--------+------------------+--------+   ICA Distal 106      35                                              +----------+--------+--------+--------+------------------+--------+   ECA        92       16                calcific                      +----------+--------+--------+--------+------------------+--------+   +----------+--------+--------+----------------+-------------------+              PSV cm/s EDV cm/s Describe         Arm Pressure (mmHG)   +----------+--------+--------+----------------+-------------------+   Subclavian 98  Multiphasic, WNL                       +----------+--------+--------+----------------+-------------------+   +---------+--------+--+--------+--+---------+   Vertebral PSV cm/s 55 EDV cm/s 12 Antegrade    +---------+--------+--+--------+--+---------+           Summary:  Right Carotid: Velocities in the right ICA are consistent with a 80-99%                 stenosis.   Left Carotid: The extracranial vessels were near-normal with only minimal  wall                thickening or plaque.   Vertebrals:  Bilateral vertebral arteries demonstrate antegrade flow.  Subclavians: Normal flow hemodynamics were seen in bilateral subclavian               arteries.     Assessment/Plan:     66 year old male with high-grade right ICA stenosis.  There are no bruits on exam today.  Given the trickle flow I discussed with the patient proceeding with CT angio and he has agreed.  I will follow him up to discuss results after the scan is performed.  We discussed the signs and symptoms of stroke for which to seek emergent medical evaluation he demonstrates good understanding.     Waynetta Sandy MD [facility]

## 2021-05-25 ENCOUNTER — Telehealth: Payer: Self-pay

## 2021-05-25 DIAGNOSIS — E1169 Type 2 diabetes mellitus with other specified complication: Secondary | ICD-10-CM | POA: Diagnosis not present

## 2021-05-25 NOTE — Telephone Encounter (Signed)
Colon Flattery (KeyLouanna Raw) Rx #: [id number] Repatha SureClick 140MG /ML auto-injectors  Message from Plan PA Case: [id number], Status: Approved, Coverage Starts on: 04/08/2021 12:00:00 AM, Coverage Ends on: 04/07/2022 12:00:00 AM. Questions? Contact [phone].

## 2021-05-28 DIAGNOSIS — F3341 Major depressive disorder, recurrent, in partial remission: Secondary | ICD-10-CM | POA: Diagnosis not present

## 2021-05-28 DIAGNOSIS — I10 Essential (primary) hypertension: Secondary | ICD-10-CM | POA: Diagnosis not present

## 2021-05-28 DIAGNOSIS — J0100 Acute maxillary sinusitis, unspecified: Secondary | ICD-10-CM | POA: Diagnosis not present

## 2021-05-28 DIAGNOSIS — I2510 Atherosclerotic heart disease of native coronary artery without angina pectoris: Secondary | ICD-10-CM | POA: Diagnosis not present

## 2021-05-28 DIAGNOSIS — R7989 Other specified abnormal findings of blood chemistry: Secondary | ICD-10-CM | POA: Diagnosis not present

## 2021-05-28 DIAGNOSIS — J309 Allergic rhinitis, unspecified: Secondary | ICD-10-CM | POA: Diagnosis not present

## 2021-05-28 DIAGNOSIS — N184 Chronic kidney disease, stage 4 (severe): Secondary | ICD-10-CM | POA: Diagnosis not present

## 2021-05-28 DIAGNOSIS — E1169 Type 2 diabetes mellitus with other specified complication: Secondary | ICD-10-CM | POA: Diagnosis not present

## 2021-06-13 ENCOUNTER — Other Ambulatory Visit: Payer: Self-pay

## 2021-06-13 DIAGNOSIS — I6521 Occlusion and stenosis of right carotid artery: Secondary | ICD-10-CM

## 2021-06-21 NOTE — Progress Notes (Signed)
Name: Mario Green  MRN/ DOB: [id number], July 07, 1955   Age/ Sex: 66 y.o., male    PCP: Simone Curia, MD   Reason for Endocrinology Evaluation: Type 2 Diabetes Mellitus     Date of Initial Endocrinology Visit: 06/22/2021     PATIENT IDENTIFIER: Mario Green is a 66 y.o. male with a past medical history of CAD, HTN, T2DM, CKD and depression . The patient presented for initial endocrinology clinic visit on 06/22/2021 for consultative assistance with his diabetes management.    HPI: Mario Green was    Diagnosed with DM years ago  Prior Medications tried/Intolerance: Metformin Currently checking blood sugars multiple  x / day, through CGM  Hypoglycemia episodes : yes               Symptoms: yes                 Frequency: 1/ month Hemoglobin A1c peaking at 8.0% in 04/2021.   In terms of diet, the patient eats 3 meals a day, snacks occasionally   , avoids sugar-sweetened beverages   He adjust his levemir based on his fasting BG    Has burping issues but no nausea or diarrhea that he attributes to Truliciy    Nephrology : Dr. Kendell Bane  Father with hx of DM    HOME DIABETES REGIMEN: Glimepiride 4 mg BID Trulicity 4.5 mg weekly - Pt assistance  Levemir 18 units daily BID - pt assistance     Statin: No, pt on Repatha  ACE-I/ARB: no Prior Diabetic Education: yes   CONTINUOUS GLUCOSE MONITORING RECORD INTERPRETATION    Dates of Recording: 3/4-3/17/2023  Sensor description:freestyle libre  Results statistics:   CGM use % of time 68  Average and SD 170/30.4  Time in range      65  %  % Time Above 180 26  % Time above 250 8  % Time Below target 1     Glycemic patterns summary: Hyperglycemia noted during the day and trends down overnight   Hyperglycemic episodes  during the day  Hypoglycemic episodes occurred rare- fasting   Overnight periods: optimal      DIABETIC COMPLICATIONS: Microvascular complications:  CKD IV,  Denies: retinopathy, neuropathy   Last eye exam: Completed 2022  Macrovascular complications:  CAD, carotid artery disease Denies: PVD, CVA   PAST HISTORY: Past Medical History:  Past Medical History:  Diagnosis Date   Allergic rhinitis    Anemia    Benign essential hypertension    CAD (coronary artery disease)    CKD (chronic kidney disease), symptom management only, stage 3 (moderate) (HCC)    Dyslipidemia 03/24/2015   ED (erectile dysfunction)    Hyperlipidemia    Morbid obesity due to excess calories (HCC) 03/24/2015   Obstructive sleep apnea 03/24/2015   Renal insufficiency    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 LIMA to LAD, SVG to diagonal 1, SVG to PDA   Tinnitus, bilateral    Type 2 diabetes mellitus without complication (HCC) 03/24/2015   Past Surgical History:  Past Surgical History:  Procedure Laterality Date   ADENOIDECTOMY     CARDIAC SURGERY     Tripple bypass    Social History:  reports that he has never smoked. He has quit using smokeless tobacco.  His smokeless tobacco use included chew. He reports that he does not currently use alcohol. He reports that he does  not use drugs. Family History:  Family History  Problem Relation Age of Onset   Dementia Mother    Diabetes Father    Heart attack Father      HOME MEDICATIONS: Allergies as of 06/22/2021   No Known Allergies      Medication List        Accurate as of June 22, 2021 12:08 PM. If you have any questions, ask your nurse or doctor.          amLODipine 2.5 MG tablet Commonly known as: NORVASC Take 2.5 mg by mouth at bedtime.   aspirin 81 MG EC tablet Take 81 mg by mouth daily.   clopidogrel 75 MG tablet Commonly known as: PLAVIX Take 1 tablet by mouth daily.   Easy Comfort Pen Needles 32G X 4 MM Misc Generic drug: Insulin Pen Needle   Easy Comfort Pen Needles 31G X 5 MM Misc Generic drug: Insulin Pen Needle   Evolocumab 140  MG/ML Soaj Inject 140 mg into the skin every 14 (fourteen) days. Inject 1 pen every 14 days subcutaneously   fenofibrate 160 MG tablet Take 1 tablet by mouth daily at 6 (six) AM.   Fish Oil 1000 MG Caps Take by mouth.   gabapentin 300 MG capsule Commonly known as: NEURONTIN Take 300 mg by mouth at bedtime as needed (PAIN).   gabapentin 100 MG capsule Commonly known as: NEURONTIN Take 100 mg by mouth 2 (two) times daily as needed for pain.   glimepiride 4 MG tablet Commonly known as: AMARYL Take 4 mg by mouth daily.   insulin detemir 100 UNIT/ML FlexPen Commonly known as: LEVEMIR Inject 18 Units into the skin 2 (two) times daily.   metoprolol tartrate 100 MG tablet Commonly known as: LOPRESSOR Take 50 mg by mouth daily.   tadalafil 10 MG tablet Commonly known as: CIALIS Take 10 mg by mouth daily as needed for erectile dysfunction.   tamsulosin 0.4 MG Caps capsule Commonly known as: FLOMAX Take 0.4 mg by mouth at bedtime.   Trulicity 4.5 MG/0.5ML Sopn Generic drug: Dulaglutide Inject 4.5 mg into the skin once a week.         ALLERGIES: No Known Allergies   REVIEW OF SYSTEMS: A comprehensive ROS was conducted with the patient and is negative except as per HPI     OBJECTIVE:   VITAL SIGNS: BP 130/72 (BP Location: Left Arm, Patient Position: Sitting, Cuff Size: Small)   Pulse 63   Ht 6' (1.829 m)   Wt (!) 304 lb (137.9 kg)   SpO2 97%   BMI 41.23 kg/m    PHYSICAL EXAM:  General: Pt appears well and is in NAD  Neck: General: Supple without adenopathy or carotid bruits. Thyroid: Thyroid size normal.  No goiter or nodules appreciated.  Lungs: Clear with good BS bilat with no rales, rhonchi, or wheezes  Heart: RRR   Abdomen: Normoactive bowel sounds, soft, nontender, without masses or organomegaly palpable  Extremities:  Lower extremities - No pretibial edema. No lesions.  Neuro: MS is good with appropriate affect, pt is alert and Ox3    DATA  REVIEWED:  04/30/2021 A1c 8.0% Tg 109 mg/dL HDL 31 LDL 59   BUN 69 Cr. 3.03 GFR 22  ASSESSMENT / PLAN / RECOMMENDATIONS:   1) Type 2 Diabetes Mellitus, suboptimally controlled, With CKD IVand macrovascular  complications - Most recent A1c of 7.6%. Goal A1c < 7.0 %.    -We discussed the importance of glycemic control to  slow down any renal damage as well as prevent blindness and neuropathy. -I have recommended against adjusting his basal insulin based on fasting BG's, he was also advised that he could take Levemir once a day rather than twice -We also discussed increased risk of combination of sulfonylurea with insulin in the setting of age> 65 and CKD and I have recommended discontinuation of glimepiride -We reviewed his CGM download which shows mainly postprandial hyperglycemia, I have recommended adding prandial insulin which he is in agreement of Discussed pharmacokinetics of basal/bolus insulin and the importance of taking prandial insulin with meals.  We also discussed avoiding sugar-sweetened beverages and snacks, when possible.  -Limited glycemic agents due to CKD IV -Patient is on patient assistance program for Levemir and Trulicity, I am going to send NovoLog to his local pharmacy and if there is an issue in obtaining this will submit a patient assistance documentation to [employer]    MEDICATIONS: Stop glimepiride Start NovoLog 10 units 3 times daily before every meal Take Levemir 36 units once daily Correction factor: NovoLog (BG -130/30) Continue Trulicity 4.5 mg weekly  EDUCATION / INSTRUCTIONS: BG monitoring instructions: Patient is instructed to check his blood sugars 3 times a day, before meals. Call [place] [facility] if: BG persistently < 70 I reviewed the Rule of 15 for the treatment of hypoglycemia in detail with the patient. Literature supplied.   2) Diabetic complications:  Eye: Does not have known diabetic retinopathy.  Neuro/ Feet: Does not  have known diabetic peripheral neuropathy. Renal: Patient does  have known baseline CKD. He is  on an ACEI/ARB at present.    3)CAD/Dyslipidemia/carotid artery disease:   -Per cardiology -Patient on Repatha  Follow-up in 3 months    Signed electronically by: Lyndle Herrlich, MD  [facility] End[facility]  [facility] [address] [place] [place], [place] [address] Phone: [phone] FAX: [phone]   CC: Simone Curia, MD [address] [place] [address] Phone: [phone]  Fax: [phone]    Return to Endocrinology clinic as below: Future Appointments  Date Time Provider Department Center  07/04/2021 12:20 PM Maeola Harman, MD [facility]  11/12/2021 11:20 AM Baldo Daub, MD [facility] None

## 2021-06-22 ENCOUNTER — Ambulatory Visit (INDEPENDENT_AMBULATORY_CARE_PROVIDER_SITE_OTHER): Payer: Medicare HMO | Admitting: Internal Medicine

## 2021-06-22 ENCOUNTER — Encounter: Payer: Self-pay | Admitting: Internal Medicine

## 2021-06-22 ENCOUNTER — Other Ambulatory Visit: Payer: Self-pay

## 2021-06-22 VITALS — BP 130/72 | HR 63 | Ht 72.0 in | Wt 304.0 lb

## 2021-06-22 DIAGNOSIS — E1159 Type 2 diabetes mellitus with other circulatory complications: Secondary | ICD-10-CM | POA: Diagnosis not present

## 2021-06-22 DIAGNOSIS — N184 Chronic kidney disease, stage 4 (severe): Secondary | ICD-10-CM | POA: Diagnosis not present

## 2021-06-22 DIAGNOSIS — E1122 Type 2 diabetes mellitus with diabetic chronic kidney disease: Secondary | ICD-10-CM | POA: Insufficient documentation

## 2021-06-22 DIAGNOSIS — R739 Hyperglycemia, unspecified: Secondary | ICD-10-CM

## 2021-06-22 DIAGNOSIS — E119 Type 2 diabetes mellitus without complications: Secondary | ICD-10-CM | POA: Insufficient documentation

## 2021-06-22 DIAGNOSIS — Z794 Long term (current) use of insulin: Secondary | ICD-10-CM

## 2021-06-22 HISTORY — DX: Type 2 diabetes mellitus with diabetic chronic kidney disease: E11.22

## 2021-06-22 HISTORY — DX: Type 2 diabetes mellitus without complications: E11.9

## 2021-06-22 LAB — POCT GLUCOSE (DEVICE FOR HOME USE): Glucose Fasting, POC: 7.6 mg/dL — AB (ref 70–99)

## 2021-06-22 MED ORDER — INSULIN PEN NEEDLE 29G X 5MM MISC: 1.0000 | Freq: Four times a day (QID) | 3 refills | Status: AC

## 2021-06-22 MED ORDER — NOVOLOG FLEXPEN 100 UNIT/ML ~~LOC~~ SOPN: PEN_INJECTOR | SUBCUTANEOUS | 11 refills | Status: DC

## 2021-06-22 NOTE — Patient Instructions (Addendum)
-  Stop Glimepiride  ?-Continue Trulicity 4.5 mg weekly ?-Start Novolog 10 units three times daily with each meal  ?-Take Levemir 36 units ONCE daily  ?-Novolog correctional insulin: ADD extra units on insulin to your meal-time Novolog dose if your blood sugars are higher than 160. Use the scale below to help guide you:  ? ?Blood sugar before meal Number of units to inject  ?Less than 160 0 unit  ?161 -  190 1 units  ?191 -  220 2 units  ?221 -  250 3 units  ?251 -  280 4 units  ?281 -  310 5 units  ?311 -  340 6 units  ?341 -  370 7 units  ?371 -  400 8 units  ? ? ? ?HOW TO TREAT LOW BLOOD SUGARS (Blood sugar LESS THAN 70 MG/DL) ?Please follow the RULE OF 15 for the treatment of hypoglycemia treatment (when your (blood sugars are less than 70 mg/dL)  ? ?STEP 1: Take 15 grams of carbohydrates when your blood sugar is low, which includes:  ?3-4 GLUCOSE TABS  OR ?3-4 OZ OF JUICE OR REGULAR SODA OR ?ONE TUBE OF GLUCOSE GEL   ? ?STEP 2: RECHECK blood sugar in 15 MINUTES ?STEP 3: If your blood sugar is still low at the 15 minute recheck --> then, go back to STEP 1 and treat AGAIN with another 15 grams of carbohydrates. ? ?

## 2021-06-25 ENCOUNTER — Encounter: Payer: Self-pay | Admitting: Internal Medicine

## 2021-06-25 DIAGNOSIS — N184 Chronic kidney disease, stage 4 (severe): Secondary | ICD-10-CM | POA: Diagnosis not present

## 2021-06-25 DIAGNOSIS — R7989 Other specified abnormal findings of blood chemistry: Secondary | ICD-10-CM | POA: Diagnosis not present

## 2021-06-25 DIAGNOSIS — J309 Allergic rhinitis, unspecified: Secondary | ICD-10-CM | POA: Diagnosis not present

## 2021-06-25 DIAGNOSIS — D649 Anemia, unspecified: Secondary | ICD-10-CM | POA: Diagnosis not present

## 2021-06-25 DIAGNOSIS — E1169 Type 2 diabetes mellitus with other specified complication: Secondary | ICD-10-CM | POA: Diagnosis not present

## 2021-06-25 DIAGNOSIS — H9313 Tinnitus, bilateral: Secondary | ICD-10-CM | POA: Diagnosis not present

## 2021-06-25 DIAGNOSIS — I2510 Atherosclerotic heart disease of native coronary artery without angina pectoris: Secondary | ICD-10-CM | POA: Diagnosis not present

## 2021-06-25 DIAGNOSIS — F3341 Major depressive disorder, recurrent, in partial remission: Secondary | ICD-10-CM | POA: Diagnosis not present

## 2021-06-25 DIAGNOSIS — I10 Essential (primary) hypertension: Secondary | ICD-10-CM | POA: Diagnosis not present

## 2021-06-25 DIAGNOSIS — I6521 Occlusion and stenosis of right carotid artery: Secondary | ICD-10-CM | POA: Diagnosis not present

## 2021-06-26 ENCOUNTER — Telehealth: Payer: Self-pay

## 2021-06-26 NOTE — Telephone Encounter (Signed)
Pt could not have CT yesterday due to "kidney function". Per MD, he is to have repeat carotid duplex and will f/u with MD after. Pt is aware of this and his upcoming appts. ?

## 2021-06-27 ENCOUNTER — Other Ambulatory Visit: Payer: Self-pay

## 2021-06-27 DIAGNOSIS — I6521 Occlusion and stenosis of right carotid artery: Secondary | ICD-10-CM

## 2021-06-29 ENCOUNTER — Ambulatory Visit (HOSPITAL_COMMUNITY)
Admission: RE | Admit: 2021-06-29 | Discharge: 2021-06-29 | Disposition: A | Discharge status: Home / Self Care | Payer: Medicare HMO | Source: Ambulatory Visit | Attending: Vascular Surgery | Admitting: Vascular Surgery

## 2021-06-29 ENCOUNTER — Other Ambulatory Visit: Payer: Self-pay

## 2021-06-29 DIAGNOSIS — I6521 Occlusion and stenosis of right carotid artery: Secondary | ICD-10-CM | POA: Insufficient documentation

## 2021-07-04 ENCOUNTER — Ambulatory Visit: Payer: Medicare HMO | Admitting: Vascular Surgery

## 2021-07-04 ENCOUNTER — Encounter: Payer: Self-pay | Admitting: Vascular Surgery

## 2021-07-04 ENCOUNTER — Other Ambulatory Visit: Payer: Self-pay

## 2021-07-04 VITALS — BP 119/61 | HR 56 | Temp 97.3°F | Resp 20 | Ht 72.0 in | Wt 305.8 lb

## 2021-07-04 DIAGNOSIS — I6521 Occlusion and stenosis of right carotid artery: Secondary | ICD-10-CM

## 2021-07-04 NOTE — H&P (View-Only) (Signed)
? ?Patient ID: Mario Green, male   DOB: Jul 29, 1955, 66 y.o.   MRN: [id number] ? ?Reason for Consult: No chief complaint on file. ?  ?Referred by Cher Nakai, MD ? ?Subjective:  ?   ?HPI: ? ?Mario Green is a 66 y.o. male history of coronary artery disease as well as hyperlipidemia and type 2 diabetes.  He is a lifelong non-smoker.  Initially was found to have carotid stenosis that was asymptomatic.  Continues on aspirin, Plavix and Repatha due to intolerance to statins.  Continues to deny any history of stroke, TIA or amaurosis.  I initially sent him for CTA given trickle flow identified on original ultrasound.  This unfortunately cannot be performed due to decreased renal function and he is now here with follow-up repeat duplex. ? ?Past Medical History:  ?Diagnosis Date  ? Allergic rhinitis   ? Anemia   ? Benign essential hypertension   ? CAD (coronary artery disease)   ? CKD (chronic kidney disease), symptom management only, stage 3 (moderate) (HCC)   ? Dyslipidemia 03/24/2015  ? ED (erectile dysfunction)   ? Hyperlipidemia   ? Morbid obesity due to excess calories ([place]) 03/24/2015  ? Obstructive sleep apnea 03/24/2015  ? Renal insufficiency   ? 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 Formatting of this note might be different from the original. LIMA to LAD, SVG to diagonal 1, SVG to PDA  ? Tinnitus, bilateral   ? Type 2 diabetes mellitus without complication ([place]) 69/79/4801  ? ?Family History  ?Problem Relation Age of Onset  ? Dementia Mother   ? Diabetes Father   ? Heart attack Father   ? ?Past Surgical History:  ?Procedure Laterality Date  ? ADENOIDECTOMY    ? CARDIAC SURGERY    ? Tripple bypass  ? ? ?Short Social History:  ?Social History  ? ?Tobacco Use  ? Smoking status: Never  ? Smokeless tobacco: Former  ?  Types: Chew  ?Substance Use Topics  ? Alcohol use: Not Currently  ?  Comment: occasional  ? ? ?No Known Allergies ? ?Current Outpatient Medications  ?Medication Sig Dispense Refill   ? amLODipine (NORVASC) 2.5 MG tablet Take 2.5 mg by mouth at bedtime.    ? aspirin 81 MG EC tablet Take 81 mg by mouth daily.    ? clopidogrel (PLAVIX) 75 MG tablet Take 1 tablet by mouth daily.    ? Dulaglutide (TRULICITY) 4.5 KP/5.3ZS SOPN Inject 4.5 mg into the skin once a week.    ? Evolocumab 140 MG/ML SOAJ Inject 140 mg into the skin every 14 (fourteen) days. Inject 1 pen every 14 days subcutaneously 2 mL 11  ? fenofibrate 160 MG tablet Take 1 tablet by mouth daily at 6 (six) AM.    ? gabapentin (NEURONTIN) 100 MG capsule Take 100 mg by mouth 2 (two) times daily as needed for pain. (Patient not taking: Reported on 06/22/2021)    ? gabapentin (NEURONTIN) 300 MG capsule Take 300 mg by mouth at bedtime as needed (PAIN). (Patient not taking: Reported on 06/22/2021)    ? insulin aspart (NOVOLOG FLEXPEN) 100 UNIT/ML FlexPen Max daily 50 units 15 mL 11  ? insulin detemir (LEVEMIR) 100 UNIT/ML FlexPen Inject 18 Units into the skin 2 (two) times daily.    ? Insulin Pen Needle 29G X 5MM MISC 1 Device by Does not apply route in the morning, at noon, in the evening, and at bedtime. 400 each 3  ? metoprolol tartrate (  LOPRESSOR) 100 MG tablet Take 50 mg by mouth daily.    ? Omega-3 Fatty Acids (FISH OIL) 1000 MG CAPS Take by mouth.    ? tadalafil (CIALIS) 10 MG tablet Take 10 mg by mouth daily as needed for erectile dysfunction. (Patient not taking: Reported on 06/22/2021)    ? tamsulosin (FLOMAX) 0.4 MG CAPS capsule Take 0.4 mg by mouth at bedtime.    ? ?No current facility-administered medications for this visit.  ? ? ?Review of Systems  ?Constitutional:  Constitutional negative. ?HENT: HENT negative.  ?Eyes: Eyes negative.  ?Respiratory: Respiratory negative.  ?Cardiovascular: Cardiovascular negative.  ?GI: Gastrointestinal negative.  ?Musculoskeletal: Musculoskeletal negative.  ?Skin: Skin negative.  ?Neurological: Neurological negative. ?Hematologic: Hematologic/lymphatic negative.  ?Psychiatric: Psychiatric negative.    ? ?   ?Objective:  ?Objective  ?Vitals:  ? 07/04/21 1034  ?BP: 119/61  ?Pulse: (!) 56  ?Resp: 20  ?Temp: (!) 97.3 ?F (36.3 ?C)  ? ? ? ?Physical Exam ?HENT:  ?   Head: Normocephalic.  ?   Nose: Nose normal.  ?Eyes:  ?   Pupils: Pupils are equal, round, and reactive to light.  ?Neck:  ?   Vascular: No carotid bruit.  ?Cardiovascular:  ?   Rate and Rhythm: Normal rate.  ?Pulmonary:  ?   Effort: Pulmonary effort is normal.  ?Abdominal:  ?   General: Abdomen is flat.  ?   Palpations: Abdomen is soft.  ?Musculoskeletal:     ?   General: Normal range of motion.  ?   Cervical back: Normal range of motion.  ?   Right lower leg: No edema.  ?   Left lower leg: No edema.  ?Lymphadenopathy:  ?   Cervical: No cervical adenopathy.  ?Skin: ?   General: Skin is warm.  ?   Capillary Refill: Capillary refill takes less than 2 seconds.  ?Neurological:  ?   General: No focal deficit present.  ?   Mental Status: He is alert.  ?Psychiatric:     ?   Mood and Affect: Mood normal.     ?   Behavior: Behavior normal.  ? ? ?Data: ?Right Carotid Findings:  ?+----------+--------+--------+--------+-------------------------+--------+  ?          PSV cm/sEDV cm/sStenosisPlaque Description       Comments  ?+----------+--------+--------+--------+-------------------------+--------+  ?CCA Prox  58      8                                                  ?+----------+--------+--------+--------+-------------------------+--------+  ?CCA Mid   70      13                                                 ?+----------+--------+--------+--------+-------------------------+--------+  ?CCA Distal74      15                                                 ?+----------+--------+--------+--------+-------------------------+--------+  ?ICA Prox  362     128     80-99%  heterogenous and calcific          ?+----------+--------+--------+--------+-------------------------+--------+  ?  ICA Mid   87      22                                                  ?+----------+--------+--------+--------+-------------------------+--------+  ?ICA Distal41      15                                                 ?+----------+--------+--------+--------+-------------------------+--------+  ?ECA       86      13                                                 ?+----------+--------+--------+--------+-------------------------+--------+  ? ?+----------+--------+-------+----------------+-------------------+  ?          PSV cm/sEDV cmsDescribe        Arm Pressure (mmHG)  ?+----------+--------+-------+----------------+-------------------+  ?[id number]             Multiphasic, WNL                     ?+----------+--------+-------+----------------+-------------------+  ? ?+---------+--------+--+--------+--+---------+  ?VertebralPSV cm/s45EDV cm/s11Antegrade  ?+---------+--------+--+--------+--+---------+  ? ?   ? ?Left Carotid Findings:  ?+----------+--------+--------+--------+------------------+--------+  ?          PSV cm/sEDV cm/sStenosisPlaque DescriptionComments  ?+----------+--------+--------+--------+------------------+--------+  ?CCA Prox  91      17                                          ?+----------+--------+--------+--------+------------------+--------+  ?CCA Mid   89      23                                          ?+----------+--------+--------+--------+------------------+--------+  ?CCA Distal80      16                                          ?+----------+--------+--------+--------+------------------+--------+  ?ICA Prox  67      23      1-39%   calcific                    ?+----------+--------+--------+--------+------------------+--------+  ?ICA Mid   76      27                                          ?+----------+--------+--------+--------+------------------+--------+  ?ICA Distal57      20                                           ?+----------+--------+--------+--------+------------------+--------+  ?ECA       85  16                                          ?+----------+--------+--------+--------+------------------+--------+  ? ?+----------+--------+--------+----------------+-------------------+  ?          PSV cm/sEDV cm/sDescribe        Arm Pressure (mmHG)  ?+----------+--------+---

## 2021-07-04 NOTE — Progress Notes (Signed)
? ?Patient ID: Mario Green, male   DOB: January 02, 1956, 66 y.o.   MRN: [id number] ? ?Reason for Consult: No chief complaint on file. ?  ?Referred by Cher Nakai, MD ? ?Subjective:  ?   ?HPI: ? ?Mario Green is a 66 y.o. male history of coronary artery disease as well as hyperlipidemia and type 2 diabetes.  He is a lifelong non-smoker.  Initially was found to have carotid stenosis that was asymptomatic.  Continues on aspirin, Plavix and Repatha due to intolerance to statins.  Continues to deny any history of stroke, TIA or amaurosis.  I initially sent him for CTA given trickle flow identified on original ultrasound.  This unfortunately cannot be performed due to decreased renal function and he is now here with follow-up repeat duplex. ? ?Past Medical History:  ?Diagnosis Date  ? Allergic rhinitis   ? Anemia   ? Benign essential hypertension   ? CAD (coronary artery disease)   ? CKD (chronic kidney disease), symptom management only, stage 3 (moderate) (HCC)   ? Dyslipidemia 03/24/2015  ? ED (erectile dysfunction)   ? Hyperlipidemia   ? Morbid obesity due to excess calories ([place]) 03/24/2015  ? Obstructive sleep apnea 03/24/2015  ? Renal insufficiency   ? 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 Formatting of this note might be different from the original. LIMA to LAD, SVG to diagonal 1, SVG to PDA  ? Tinnitus, bilateral   ? Type 2 diabetes mellitus without complication ([place]) 90/30/0923  ? ?Family History  ?Problem Relation Age of Onset  ? Dementia Mother   ? Diabetes Father   ? Heart attack Father   ? ?Past Surgical History:  ?Procedure Laterality Date  ? ADENOIDECTOMY    ? CARDIAC SURGERY    ? Tripple bypass  ? ? ?Short Social History:  ?Social History  ? ?Tobacco Use  ? Smoking status: Never  ? Smokeless tobacco: Former  ?  Types: Chew  ?Substance Use Topics  ? Alcohol use: Not Currently  ?  Comment: occasional  ? ? ?No Known Allergies ? ?Current Outpatient Medications  ?Medication Sig Dispense Refill   ? amLODipine (NORVASC) 2.5 MG tablet Take 2.5 mg by mouth at bedtime.    ? aspirin 81 MG EC tablet Take 81 mg by mouth daily.    ? clopidogrel (PLAVIX) 75 MG tablet Take 1 tablet by mouth daily.    ? Dulaglutide (TRULICITY) 4.5 RA/0.7MA SOPN Inject 4.5 mg into the skin once a week.    ? Evolocumab 140 MG/ML SOAJ Inject 140 mg into the skin every 14 (fourteen) days. Inject 1 pen every 14 days subcutaneously 2 mL 11  ? fenofibrate 160 MG tablet Take 1 tablet by mouth daily at 6 (six) AM.    ? gabapentin (NEURONTIN) 100 MG capsule Take 100 mg by mouth 2 (two) times daily as needed for pain. (Patient not taking: Reported on 06/22/2021)    ? gabapentin (NEURONTIN) 300 MG capsule Take 300 mg by mouth at bedtime as needed (PAIN). (Patient not taking: Reported on 06/22/2021)    ? insulin aspart (NOVOLOG FLEXPEN) 100 UNIT/ML FlexPen Max daily 50 units 15 mL 11  ? insulin detemir (LEVEMIR) 100 UNIT/ML FlexPen Inject 18 Units into the skin 2 (two) times daily.    ? Insulin Pen Needle 29G X 5MM MISC 1 Device by Does not apply route in the morning, at noon, in the evening, and at bedtime. 400 each 3  ? metoprolol tartrate (  LOPRESSOR) 100 MG tablet Take 50 mg by mouth daily.    ? Omega-3 Fatty Acids (FISH OIL) 1000 MG CAPS Take by mouth.    ? tadalafil (CIALIS) 10 MG tablet Take 10 mg by mouth daily as needed for erectile dysfunction. (Patient not taking: Reported on 06/22/2021)    ? tamsulosin (FLOMAX) 0.4 MG CAPS capsule Take 0.4 mg by mouth at bedtime.    ? ?No current facility-administered medications for this visit.  ? ? ?Review of Systems  ?Constitutional:  Constitutional negative. ?HENT: HENT negative.  ?Eyes: Eyes negative.  ?Respiratory: Respiratory negative.  ?Cardiovascular: Cardiovascular negative.  ?GI: Gastrointestinal negative.  ?Musculoskeletal: Musculoskeletal negative.  ?Skin: Skin negative.  ?Neurological: Neurological negative. ?Hematologic: Hematologic/lymphatic negative.  ?Psychiatric: Psychiatric negative.    ? ?   ?Objective:  ?Objective  ?Vitals:  ? 07/04/21 1034  ?BP: 119/61  ?Pulse: (!) 56  ?Resp: 20  ?Temp: (!) 97.3 ?F (36.3 ?C)  ? ? ? ?Physical Exam ?HENT:  ?   Head: Normocephalic.  ?   Nose: Nose normal.  ?Eyes:  ?   Pupils: Pupils are equal, round, and reactive to light.  ?Neck:  ?   Vascular: No carotid bruit.  ?Cardiovascular:  ?   Rate and Rhythm: Normal rate.  ?Pulmonary:  ?   Effort: Pulmonary effort is normal.  ?Abdominal:  ?   General: Abdomen is flat.  ?   Palpations: Abdomen is soft.  ?Musculoskeletal:     ?   General: Normal range of motion.  ?   Cervical back: Normal range of motion.  ?   Right lower leg: No edema.  ?   Left lower leg: No edema.  ?Lymphadenopathy:  ?   Cervical: No cervical adenopathy.  ?Skin: ?   General: Skin is warm.  ?   Capillary Refill: Capillary refill takes less than 2 seconds.  ?Neurological:  ?   General: No focal deficit present.  ?   Mental Status: He is alert.  ?Psychiatric:     ?   Mood and Affect: Mood normal.     ?   Behavior: Behavior normal.  ? ? ?Data: ?Right Carotid Findings:  ?+----------+--------+--------+--------+-------------------------+--------+  ?          PSV cm/sEDV cm/sStenosisPlaque Description       Comments  ?+----------+--------+--------+--------+-------------------------+--------+  ?CCA Prox  58      8                                                  ?+----------+--------+--------+--------+-------------------------+--------+  ?CCA Mid   70      13                                                 ?+----------+--------+--------+--------+-------------------------+--------+  ?CCA Distal74      15                                                 ?+----------+--------+--------+--------+-------------------------+--------+  ?ICA Prox  362     128     80-99%  heterogenous and calcific          ?+----------+--------+--------+--------+-------------------------+--------+  ?  ICA Mid   87      22                                                  ?+----------+--------+--------+--------+-------------------------+--------+  ?ICA Distal41      15                                                 ?+----------+--------+--------+--------+-------------------------+--------+  ?ECA       86      13                                                 ?+----------+--------+--------+--------+-------------------------+--------+  ? ?+----------+--------+-------+----------------+-------------------+  ?          PSV cm/sEDV cmsDescribe        Arm Pressure (mmHG)  ?+----------+--------+-------+----------------+-------------------+  ?[id number]             Multiphasic, WNL                     ?+----------+--------+-------+----------------+-------------------+  ? ?+---------+--------+--+--------+--+---------+  ?VertebralPSV cm/s45EDV cm/s11Antegrade  ?+---------+--------+--+--------+--+---------+  ? ?   ? ?Left Carotid Findings:  ?+----------+--------+--------+--------+------------------+--------+  ?          PSV cm/sEDV cm/sStenosisPlaque DescriptionComments  ?+----------+--------+--------+--------+------------------+--------+  ?CCA Prox  91      17                                          ?+----------+--------+--------+--------+------------------+--------+  ?CCA Mid   89      23                                          ?+----------+--------+--------+--------+------------------+--------+  ?CCA Distal80      16                                          ?+----------+--------+--------+--------+------------------+--------+  ?ICA Prox  67      23      1-39%   calcific                    ?+----------+--------+--------+--------+------------------+--------+  ?ICA Mid   76      27                                          ?+----------+--------+--------+--------+------------------+--------+  ?ICA Distal57      20                                           ?+----------+--------+--------+--------+------------------+--------+  ?ECA       85  16                                          ?+----------+--------+--------+--------+------------------+--------+  ? ?+----------+--------+--------+----------------+-------------------+  ?          PSV cm/sEDV cm/sDescribe        Arm Pressure (mmHG)  ?+----------+--------+---

## 2021-07-09 ENCOUNTER — Other Ambulatory Visit: Payer: Self-pay

## 2021-07-09 DIAGNOSIS — I6521 Occlusion and stenosis of right carotid artery: Secondary | ICD-10-CM

## 2021-07-11 ENCOUNTER — Telehealth: Payer: Self-pay

## 2021-07-11 ENCOUNTER — Ambulatory Visit: Payer: Medicare HMO | Admitting: Vascular Surgery

## 2021-07-11 NOTE — Telephone Encounter (Signed)
? ?  Pre-operative Risk Assessment  ?  ?Patient Name: Mario Green  ?DOB: November 04, 1955 ?MRN: [id number]  ? ?  ? ?Request for Surgical Clearance   ? ?Procedure:   rt carotid endarterectomy ? ?Date of Surgery:  Clearance 07/31/21                              ?   ?Surgeon:  Dr. Donzetta Matters ?Surgeon's Group or Practice Name:  [facility] ?Phone number:  [phone] ?Fax number:  [phone] ?  ?Type of Clearance Requested:   ?- Medical  ?  ?Type of Anesthesia:  Not Indicated ?  ?Additional requests/questions:   ? ?Signed, ?Tennille Montelongo Tressa Busman   ?07/11/2021, 1:21 PM  ? ?

## 2021-07-12 NOTE — Telephone Encounter (Signed)
Pt has IN OFFICE appt with Dr. Bettina Gavia 07/17/21. Ok per Fabio Asa. NP pre op provider no need for tele pre op appt. I will forward clearance notes to MD for upcoming appt. Will send FYI to requesting office the pt has appt 07/17/21.  ?

## 2021-07-12 NOTE — Telephone Encounter (Signed)
? ?  Patient Name: Mario Green  ?DOB: [birth year] ?MRN: [id number] ? ?Primary Cardiologist: Shirlee More, MD ? ?Chart reviewed as part of pre-operative protocol coverage.  ? ?Preoperative team, please contact this patient and set up a phone call appointment for further cardiac evaluation. Thank you for your help. ? ?Lenna Sciara, NP ?07/12/2021, 10:36 AM ?

## 2021-07-16 ENCOUNTER — Other Ambulatory Visit (HOSPITAL_COMMUNITY): Payer: Self-pay

## 2021-07-16 NOTE — Progress Notes (Signed)
?Cardiology Office Note:   ? ?Date:  07/17/2021  ? ?ID:  Mario Green, DOB Mar 15, 1956, MRN [id number] ? ?PCP:  Cher Nakai, MD  ?Cardiologist:  Shirlee More, MD   ? ?Referring MD: Cher Nakai, MD  ? ? ?ASSESSMENT:   ? ?1. Preoperative cardiovascular examination   ?2. Stenosis of right carotid artery   ?3. Coronary artery disease of native artery of native heart with stable angina pectoris ([place])   ?4. Mixed hyperlipidemia   ?5. Mobitz type 1 second degree atrioventricular block   ? ?PLAN:   ? ?In order of problems listed above: ? ?This surgery is intermediate risk CAD is stable exercise tolerance is greater then 5-7 METS and from a cardiovascular perspective he is optimized.  He has several risk factors for surgery including his insulin-dependent diabetes CKD and CAD however I do not feel he requires any preoperative cardiac diagnostic testing.  He tells me he will be admitted overnight please place him in a cardiac monitored bed and do an EKG postoperative day 1 please contact heart care for any concerns.  He is having Mobitz 1 second-degree AV block and I have reduced his beta-blocker dosage. ?Stable CAD following surgical revascularization having no angina normal exercise tolerance continue his usual cardiac medications.  If needed clopidogrel could be withdrawn in the perioperative. ?Lipids at target continue statin ?Decrease beta-blocker dosage ? ? ?Next appointment: 6 months ? ? ?Medication Adjustments/Labs and Tests Ordered: ?Current medicines are reviewed at length with the patient today.  Concerns regarding medicines are outlined above.  ?No orders of the defined types were placed in this encounter. ? ?No orders of the defined types were placed in this encounter. ? ? ?Chief Complaint  ?Patient presents with  ? Pre-op Exam  ? ? ?History of Present Illness:   ? ?Mario Green is a 66 y.o. male with a hx of CAD CABG in 2016 stage III CKD type 2 diabetes and hyperlipidemia last seen 06/03/2021.  He is being  seen today preoperatively with right carotid endarterectomy scheduled 07/31/2021.  He has asymptomatic high-grade right internal carotid artery stenosis. ? ?He had previously been seen by my partner Dr. Agustin Cree [facility] regional cardiology last 05/06/2016. ?He had coronary angiography performed 04/24/2015 showing patent grafts and no recommendation for further intervention.  I will cut-and-paste the record from care everywhere to the chart.Marland Kitchen ?1. Severe LAD/RCA lesions  ?2. Cir with mild disease  ?3. All grafts are widely patent (LIMA to LAD, SVG to RCA, SVG to Diag )  ?4. LVEDP 12, ventriculogram was not performed due to renal insuff. Normal by  ?recent non-invasive test.  ?Diagnostic Procedure Recommendations  ?Medical treatment.  ? Signatures  ? Electronically signed by Rozann Lesches, MD(Diagnostic Physician) on  ? 04/24/2015 12:15  ? Angiographic findings  ? Cardiac Arteries and Lesion Findings  ?LMCA: Normal.  ?LAD:  ?  Lesion on Prox LAD: 95% stenosis.  ?  Lesion on Mid LAD: 95% stenosis.  ?LCx:  ?  Lesion on Prox CX: 20% stenosis.  ?  Lesion on Mid CX: 40% stenosis.  ?  Lesion on 1st Ob Marg: 30% stenosis.  ?RCA:  ?  Lesion on Prox RCA: Ostial.100% stenosis.  ?Cardiac Grafts  ? -  There is a Vein graft that originates at the Aorta Left and attaches to  ?    the 1st Diag.patent  ? -  There is a Vein graft that originates at the Aorta Right and attaches to  ?  the Mid RCA.patnet  ? -  There is a LIMA graft that originates at the LIMA and attaches to the Mid  ?    LAD.patent  ? ?Compliance with diet, lifestyle and medications: Yes ? ?He is now on insulin ?He is active he walks every day and climb 2 flights of stairs without difficulty said no angina edema shortness of breath palpitation or syncope ?He is scheduled for carotid endarterectomy 10/30/2021 ?Felt badly on high-dose metoprolol and reduced to 550% ?Past Medical History:  ?Diagnosis Date  ? Allergic rhinitis   ? Anemia   ? Benign essential hypertension   ? CAD  (coronary artery disease)   ? CKD (chronic kidney disease), symptom management only, stage 3 (moderate) (HCC)   ? Dyslipidemia 03/24/2015  ? ED (erectile dysfunction)   ? Hyperlipidemia   ? Morbid obesity due to excess calories ([place]) 03/24/2015  ? Obstructive sleep apnea 03/24/2015  ? Renal insufficiency   ? 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 Formatting of this note might be different from the original. LIMA to LAD, SVG to diagonal 1, SVG to PDA  ? Tinnitus, bilateral   ? Type 2 diabetes mellitus without complication ([place]) 41/66/0630  ? ? ?Past Surgical History:  ?Procedure Laterality Date  ? ADENOIDECTOMY    ? CARDIAC SURGERY    ? Tripple bypass  ? ? ?Current Medications: ?Current Meds  ?Medication Sig  ? amLODipine (NORVASC) 2.5 MG tablet Take 2.5 mg by mouth at bedtime.  ? aspirin 81 MG EC tablet Take 81 mg by mouth at bedtime.  ? augmented betamethasone dipropionate (DIPROLENE-AF) 0.05 % cream Apply 1 application. topically daily as needed ([place]).  ? clopidogrel (PLAVIX) 75 MG tablet Take 75 mg by mouth daily.  ? Dulaglutide (TRULICITY) 4.5 ZS/0.1UX SOPN Inject 4.5 mg into the skin every Monday.  ? Evolocumab 140 MG/ML SOAJ Inject 140 mg into the skin every 14 (fourteen) days. Inject 1 pen every 14 days subcutaneously (Patient taking differently: Inject 140 mg into the skin every 14 (fourteen) days. Repatha)  ? insulin aspart (NOVOLOG FLEXPEN) 100 UNIT/ML FlexPen Max daily 50 units (Patient taking differently: 10-12 Units 3 (three) times daily before meals. Sliding scale  ?Depending on blood glucose)  ? insulin detemir (LEVEMIR) 100 UNIT/ML FlexPen Inject 36 Units into the skin daily.  ? Insulin Pen Needle 29G X 5MM MISC 1 Device by Does not apply route in the morning, at noon, in the evening, and at bedtime.  ? metoprolol tartrate (LOPRESSOR) 50 MG tablet Take 50 mg by mouth daily.  ? Omega-3 Fatty Acids (FISH OIL) 1000 MG CAPS Take 1,000 mg by mouth 2 (two) times daily.  ? tamsulosin  (FLOMAX) 0.4 MG CAPS capsule Take 0.4 mg by mouth at bedtime.  ?  ? ?Allergies:   Patient has no known allergies.  ? ?Social History  ? ?Socioeconomic History  ? Marital status: Single  ?  Spouse name: Not on file  ? Number of children: Not on file  ? Years of education: Not on file  ? Highest education level: Not on file  ?Occupational History  ? Not on file  ?Tobacco Use  ? Smoking status: Never  ?  Passive exposure: Never  ? Smokeless tobacco: Former  ?  Types: Chew  ?Vaping Use  ? Vaping Use: Never used  ?Substance and Sexual Activity  ? Alcohol use: Not Currently  ?  Comment: occasional  ? Drug use: Never  ? Sexual activity: Not on  file  ?Other Topics Concern  ? Not on file  ?Social History Narrative  ? Not on file  ? ?Social Determinants of Health  ? ?Financial Resource Strain: Not on file  ?Food Insecurity: Not on file  ?Transportation Needs: Not on file  ?Physical Activity: Not on file  ?Stress: Not on file  ?Social Connections: Not on file  ?  ? ?Family History: ?The patient's family history includes Dementia in his mother; Diabetes in his father; Heart attack in his father. ?ROS:   ?Please see the history of present illness.    ?All other systems reviewed and are negative. ? ?EKGs/Labs/Other Studies Reviewed:   ? ?The following studies were reviewed today: ? ?EKG:  EKG ordered today and personally reviewed.  The ekg ordered today demonstrates sinus rhythm Mobitz 1 second-degree AV block without pauses left axis deviation nonspecific conduction delay ? ?Recent Labs: ?04/13/2021 ?Cholesterol 129 LDL 59 triglycerides 109 HDL 31 A1c remains elevated 8.0 creatinine elevated 2.92 potassium 4.8  ? ?Physical Exam:   ? ?VS:  BP 124/72 (BP Location: Right Arm)   Pulse (!) 54   Ht 6' (1.829 m)   Wt (!) 303 lb (137.4 kg)   SpO2 96%   BMI 41.09 kg/m?    ? ?Wt Readings from Last 3 Encounters:  ?07/17/21 (!) 303 lb (137.4 kg)  ?07/04/21 (!) 305 lb 12.8 oz (138.7 kg)  ?06/22/21 (!) 304 lb (137.9 kg)  ?  ? ?GEN:  Well  nourished, well developed in no acute distress ?HEENT: Normal ?NECK: No JVD; No carotid bruits ?LYMPHATICS: No lymphadenopathy ?CARDIAC: RRR, no murmurs, rubs, gallops ?RESPIRATORY:  Clear to auscultation with

## 2021-07-17 ENCOUNTER — Ambulatory Visit: Payer: Medicare HMO | Admitting: Cardiology

## 2021-07-17 ENCOUNTER — Encounter: Payer: Self-pay | Admitting: Cardiology

## 2021-07-17 VITALS — BP 124/72 | HR 54 | Ht 72.0 in | Wt 303.0 lb

## 2021-07-17 DIAGNOSIS — I441 Atrioventricular block, second degree: Secondary | ICD-10-CM

## 2021-07-17 DIAGNOSIS — Z01810 Encounter for preprocedural cardiovascular examination: Secondary | ICD-10-CM | POA: Diagnosis not present

## 2021-07-17 DIAGNOSIS — E782 Mixed hyperlipidemia: Secondary | ICD-10-CM

## 2021-07-17 DIAGNOSIS — I6521 Occlusion and stenosis of right carotid artery: Secondary | ICD-10-CM | POA: Diagnosis not present

## 2021-07-17 DIAGNOSIS — I25118 Atherosclerotic heart disease of native coronary artery with other forms of angina pectoris: Secondary | ICD-10-CM

## 2021-07-17 HISTORY — DX: Atrioventricular block, second degree: I44.1

## 2021-07-17 MED ORDER — METOPROLOL TARTRATE 25 MG PO TABS: 25.0000 mg | ORAL_TABLET | Freq: Every day | ORAL | 3 refills | Status: DC

## 2021-07-17 NOTE — Patient Instructions (Signed)
Medication Instructions:  ?Your physician has recommended you make the following change in your medication:  ? ?START: Metoprolol tartrate 25 mg daily ? ?*If you need a refill on your cardiac medications before your next appointment, please call your pharmacy* ? ? ?Lab Work: ?None ?If you have labs (blood work) drawn today and your tests are completely normal, you will receive your results only by: ?MyChart Message (if you have MyChart) OR ?A paper copy in the mail ?If you have any lab test that is abnormal or we need to change your treatment, we will call you to review the results. ? ? ?Testing/Procedures: ?None ? ? ?Follow-Up: ?At [facility], you and your health needs are our priority.  As part of our continuing mission to provide you with exceptional heart care, we have created designated Provider Care Teams.  These Care Teams include your primary Cardiologist (physician) and Advanced Practice Providers (APPs -  Physician Assistants and Nurse Practitioners) who all work together to provide you with the care you need, when you need it. ? ?We recommend signing up for the patient portal called "MyChart".  Sign up information is provided on this After Visit Summary.  MyChart is used to connect with patients for Virtual Visits (Telemedicine).  Patients are able to view lab/test results, encounter notes, upcoming appointments, etc.  Non-urgent messages can be sent to your provider as well.   ?To learn more about what you can do with MyChart, go to [email or web address].   ? ?Your next appointment:   ?6 month(s) ? ?The format for your next appointment:   ?In Person ? ?Provider:   ?Shirlee More, MD  ? ? ?Other Instructions ?None ? ?Important Information About Sugar ? ? ? ? ? ? ?

## 2021-07-19 NOTE — Pre-Procedure Instructions (Addendum)
Surgical Instructions ? ? ? Your procedure is scheduled on Tuesday, April 25th. ? Report to [facility] Main Entrance "A" at 8:20 A.M., then check in with the Admitting office. ? Call this number if you have problems the morning of surgery: ? [phone] ? ? If you have any questions prior to your surgery date call [phone]: Open Monday-Friday 8am-4pm ? ? ? Remember: ? Do not eat or drink after midnight the night before your surgery ?  ? Take these medicines the morning of surgery with A SIP OF WATER  ?metoprolol tartrate (LOPRESSOR)  ?clopidogrel (PLAVIX)  ? ?Continue taking aspirin as normal. ? ?As of today, STOP taking any Aleve, Naproxen, Ibuprofen, Motrin, Advil, Goody's, BC's, all herbal medications, fish oil, and all vitamins. ? ?WHAT DO I DO ABOUT MY DIABETES MEDICATION?  ? ? ?THE MORNING OF SURGERY, take 18  units of insulin detemir (LEVEMIR) insulin. ? ?The day of surgery, do not take other diabetes injectables, including Trulicity (dulaglutide). ? ?If your CBG is greater than 220 mg/dL, you may take ? of your sliding scale (correction) dose of insulin aspart (NOVOLOG FLEXPEN) insulin. ? ? ?HOW TO MANAGE YOUR DIABETES ?BEFORE AND AFTER SURGERY ? ?Why is it important to control my blood sugar before and after surgery? ?Improving blood sugar levels before and after surgery helps healing and can limit problems. ?A way of improving blood sugar control is eating a healthy diet by: ? Eating less sugar and carbohydrates ? Increasing activity/exercise ? Talking with your doctor about reaching your blood sugar goals ?High blood sugars (greater than 180 mg/dL) can raise your risk of infections and slow your recovery, so you will need to focus on controlling your diabetes during the weeks before surgery. ?Make sure that the doctor who takes care of your diabetes knows about your planned surgery including the date and location. ? ?How do I manage my blood sugar before surgery? ?Check your blood sugar at least 4  times a day, starting 2 days before surgery, to make sure that the level is not too high or low. ? ?Check your blood sugar the morning of your surgery when you wake up and every 2 hours until you get to the Short Stay unit. ? ?If your blood sugar is less than 70 mg/dL, you will need to treat for low blood sugar: ?Do not take insulin. ?Treat a low blood sugar (less than 70 mg/dL) with ? cup of clear juice (cranberry or apple), 4 glucose tablets, OR glucose gel. ?Recheck blood sugar in 15 minutes after treatment (to make sure it is greater than 70 mg/dL). If your blood sugar is not greater than 70 mg/dL on recheck, call [phone] for further instructions. ?Report your blood sugar to the short stay nurse when you get to Short Stay. ? ?If you are admitted to the hospital after surgery: ?Your blood sugar will be checked by the staff and you will probably be given insulin after surgery (instead of oral diabetes medicines) to make sure you have good blood sugar levels. ?The goal for blood sugar control after surgery is 80-180 mg/dL. ? ?         ?           ?Do NOT Smoke (Tobacco/Vaping) for 24 hours prior to your procedure. ? ?If you use a CPAP at night, you may bring your mask/headgear for your overnight stay. ?  ?Contacts, glasses, piercing's, hearing aid's, dentures or partials may not be worn into surgery, please bring cases  for these belongings.  ?  ?For patients admitted to the hospital, discharge time will be determined by your treatment team. ?  ?Patients discharged the day of surgery will not be allowed to drive home, and someone needs to stay with them for 24 hours. ? ?SURGICAL WAITING ROOM VISITATION ?Patients having surgery or a procedure may have two support people in the waiting room. These visitors may be switched out with other visitors if needed. ?Children under the age of 61 must have an adult accompany them who is not the patient. ?If the patient needs to stay at the hospital during part of their  recovery, the visitor guidelines for inpatient rooms apply. ? ?Please refer to the [place] website for the visitor guidelines for Inpatients (after your surgery is over and you are in a regular room).  ? ? ?Special instructions:   ?[place]- Preparing For Surgery ? ?Before surgery, you can play an important role. Because skin is not sterile, your skin needs to be as free of germs as possible. You can reduce the number of germs on your skin by washing with CHG (chlorahexidine gluconate) Soap before surgery.  CHG is an antiseptic cleaner which kills germs and bonds with the skin to continue killing germs even after washing.   ? ?Oral Hygiene is also important to reduce your risk of infection.  Remember - BRUSH YOUR TEETH THE MORNING OF SURGERY WITH YOUR REGULAR TOOTHPASTE ? ?Please do not use if you have an allergy to CHG or antibacterial soaps. If your skin becomes reddened/irritated stop using the CHG.  ?Do not shave (including legs and underarms) for at least 48 hours prior to first CHG shower. It is OK to shave your face. ? ?Please follow these instructions carefully. ?  ?Shower the NIGHT BEFORE SURGERY and the MORNING OF SURGERY ? ?If you chose to wash your hair, wash your hair first as usual with your normal shampoo. ? ?After you shampoo, rinse your hair and body thoroughly to remove the shampoo. ? ?Use CHG Soap as you would any other liquid soap. You can apply CHG directly to the skin and wash gently with a scrungie or a clean washcloth.  ? ?Apply the CHG Soap to your body ONLY FROM THE NECK DOWN.  Do not use on open wounds or open sores. Avoid contact with your eyes, ears, mouth and genitals (private parts). Wash Face and genitals (private parts)  with your normal soap.  ? ?Wash thoroughly, paying special attention to the area where your surgery will be performed. ? ?Thoroughly rinse your body with warm water from the neck down. ? ?DO NOT shower/wash with your normal soap after using and rinsing off the  CHG Soap. ? ?Pat yourself dry with a CLEAN TOWEL. ? ?Wear CLEAN PAJAMAS to bed the night before surgery ? ?Place CLEAN SHEETS on your bed the night before your surgery ? ?DO NOT SLEEP WITH PETS. ? ? ?Day of Surgery: ?Take a shower with CHG soap. ?Do not wear jewelry ?Do not wear lotions, powders, colognes, or deodorant. ?Men may shave face and neck. ?Do not bring valuables to the hospital.  ?[facility] is not responsible for any belongings or valuables. ?Wear Clean/Comfortable clothing the morning of surgery ?Remember to brush your teeth WITH YOUR REGULAR TOOTHPASTE. ?  ?Please read over the following fact sheets that you were given. ? ? ? ?If you received a COVID test during your pre-op visit  it is requested that you wear a mask when  out in public, stay away from anyone that may not be feeling well and notify your surgeon if you develop symptoms. If you have been in contact with anyone that has tested positive in the last 10 days please notify you surgeon. ? ?

## 2021-07-20 ENCOUNTER — Encounter (HOSPITAL_COMMUNITY)
Admission: RE | Admit: 2021-07-20 | Discharge: 2021-07-20 | Disposition: A | Discharge status: Home / Self Care | Payer: Medicare HMO | Source: Ambulatory Visit | Attending: Vascular Surgery | Admitting: Vascular Surgery

## 2021-07-23 DIAGNOSIS — F3341 Major depressive disorder, recurrent, in partial remission: Secondary | ICD-10-CM | POA: Diagnosis not present

## 2021-07-23 DIAGNOSIS — E1169 Type 2 diabetes mellitus with other specified complication: Secondary | ICD-10-CM | POA: Diagnosis not present

## 2021-07-23 DIAGNOSIS — N184 Chronic kidney disease, stage 4 (severe): Secondary | ICD-10-CM | POA: Diagnosis not present

## 2021-07-23 DIAGNOSIS — R7989 Other specified abnormal findings of blood chemistry: Secondary | ICD-10-CM | POA: Diagnosis not present

## 2021-07-23 DIAGNOSIS — D649 Anemia, unspecified: Secondary | ICD-10-CM | POA: Diagnosis not present

## 2021-07-23 DIAGNOSIS — J309 Allergic rhinitis, unspecified: Secondary | ICD-10-CM | POA: Diagnosis not present

## 2021-07-23 DIAGNOSIS — H9313 Tinnitus, bilateral: Secondary | ICD-10-CM | POA: Diagnosis not present

## 2021-07-23 DIAGNOSIS — I2510 Atherosclerotic heart disease of native coronary artery without angina pectoris: Secondary | ICD-10-CM | POA: Diagnosis not present

## 2021-07-24 ENCOUNTER — Other Ambulatory Visit: Payer: Self-pay

## 2021-07-24 ENCOUNTER — Encounter (HOSPITAL_COMMUNITY)
Admission: RE | Admit: 2021-07-24 | Discharge: 2021-07-24 | Disposition: A | Discharge status: Home / Self Care | Payer: Medicare HMO | Source: Ambulatory Visit | Attending: Vascular Surgery | Admitting: Vascular Surgery

## 2021-07-24 ENCOUNTER — Encounter (HOSPITAL_COMMUNITY): Payer: Self-pay

## 2021-07-24 VITALS — BP 117/66 | HR 92 | Temp 97.6°F | Resp 19 | Ht 72.0 in | Wt 304.0 lb

## 2021-07-24 DIAGNOSIS — E785 Hyperlipidemia, unspecified: Secondary | ICD-10-CM | POA: Insufficient documentation

## 2021-07-24 DIAGNOSIS — I129 Hypertensive chronic kidney disease with stage 1 through stage 4 chronic kidney disease, or unspecified chronic kidney disease: Secondary | ICD-10-CM | POA: Insufficient documentation

## 2021-07-24 DIAGNOSIS — I6521 Occlusion and stenosis of right carotid artery: Secondary | ICD-10-CM | POA: Diagnosis not present

## 2021-07-24 DIAGNOSIS — Z794 Long term (current) use of insulin: Secondary | ICD-10-CM | POA: Insufficient documentation

## 2021-07-24 DIAGNOSIS — Z01818 Encounter for other preprocedural examination: Secondary | ICD-10-CM

## 2021-07-24 DIAGNOSIS — I2510 Atherosclerotic heart disease of native coronary artery without angina pectoris: Secondary | ICD-10-CM | POA: Diagnosis not present

## 2021-07-24 DIAGNOSIS — G4733 Obstructive sleep apnea (adult) (pediatric): Secondary | ICD-10-CM | POA: Insufficient documentation

## 2021-07-24 DIAGNOSIS — Z01812 Encounter for preprocedural laboratory examination: Secondary | ICD-10-CM | POA: Diagnosis not present

## 2021-07-24 DIAGNOSIS — I441 Atrioventricular block, second degree: Secondary | ICD-10-CM | POA: Insufficient documentation

## 2021-07-24 DIAGNOSIS — Z951 Presence of aortocoronary bypass graft: Secondary | ICD-10-CM | POA: Diagnosis not present

## 2021-07-24 DIAGNOSIS — E119 Type 2 diabetes mellitus without complications: Secondary | ICD-10-CM | POA: Insufficient documentation

## 2021-07-24 DIAGNOSIS — E1122 Type 2 diabetes mellitus with diabetic chronic kidney disease: Secondary | ICD-10-CM

## 2021-07-24 LAB — URINALYSIS, ROUTINE W REFLEX MICROSCOPIC
Bilirubin Urine: NEGATIVE
Glucose, UA: NEGATIVE mg/dL
Hgb urine dipstick: NEGATIVE
Ketones, ur: NEGATIVE mg/dL
Leukocytes,Ua: NEGATIVE
Nitrite: NEGATIVE
Protein, ur: NEGATIVE mg/dL
Specific Gravity, Urine: 1.008 (ref 1.005–1.030)
pH: 5 (ref 5.0–8.0)

## 2021-07-24 LAB — SURGICAL PCR SCREEN
MRSA, PCR: NEGATIVE
Staphylococcus aureus: POSITIVE — AB

## 2021-07-24 LAB — COMPREHENSIVE METABOLIC PANEL
ALT: 26 U/L (ref 0–44)
AST: 33 U/L (ref 15–41)
Albumin: 3.9 g/dL (ref 3.5–5.0)
Alkaline Phosphatase: 47 U/L (ref 38–126)
Anion gap: 9 (ref 5–15)
BUN: 51 mg/dL — ABNORMAL HIGH (ref 8–23)
CO2: 24 mmol/L (ref 22–32)
Calcium: 9 mg/dL (ref 8.9–10.3)
Chloride: 106 mmol/L (ref 98–111)
Creatinine, Ser: 2.55 mg/dL — ABNORMAL HIGH (ref 0.61–1.24)
GFR, Estimated: 27 mL/min — ABNORMAL LOW (ref >60)
Glucose, Bld: 158 mg/dL — ABNORMAL HIGH (ref 70–99)
Potassium: 4.3 mmol/L (ref 3.5–5.1)
Sodium: 139 mmol/L (ref 135–145)
Total Bilirubin: 0.8 mg/dL (ref 0.3–1.2)
Total Protein: 6.9 g/dL (ref 6.5–8.1)

## 2021-07-24 LAB — PROTIME-INR
INR: 1 (ref 0.8–1.2)
Prothrombin Time: 12.9 seconds (ref 11.4–15.2)

## 2021-07-24 LAB — TYPE AND SCREEN
ABO/RH(D): A POS
Antibody Screen: NEGATIVE

## 2021-07-24 LAB — CBC
HCT: 41.8 % (ref 39.0–52.0)
Hemoglobin: 13.6 g/dL (ref 13.0–17.0)
MCH: 30.2 pg (ref 26.0–34.0)
MCHC: 32.5 g/dL (ref 30.0–36.0)
MCV: 92.9 fL (ref 80.0–100.0)
Platelets: 174 10*3/uL (ref 150–400)
RBC: 4.5 MIL/uL (ref 4.22–5.81)
RDW: 13.6 % (ref 11.5–15.5)
WBC: 7.4 10*3/uL (ref 4.0–10.5)
nRBC: 0 % (ref 0.0–0.2)

## 2021-07-24 LAB — APTT: aPTT: 28 seconds (ref 24–36)

## 2021-07-24 LAB — HEMOGLOBIN A1C
Hgb A1c MFr Bld: 7 % — ABNORMAL HIGH (ref 4.8–5.6)
Mean Plasma Glucose: 154.2 mg/dL

## 2021-07-24 LAB — GLUCOSE, CAPILLARY: Glucose-Capillary: 162 mg/dL — ABNORMAL HIGH (ref 70–99)

## 2021-07-24 NOTE — Pre-Procedure Instructions (Signed)
Surgical Instructions ? ? ? Your procedure is scheduled on Tuesday, April 25th. ? ? Report to [facility] Main Entrance "A" at 8:20 A.M., then check in with the Admitting office. ? Call this number if you have problems the morning of surgery: ? [phone] ? ? If you have any questions prior to your surgery date call [phone]: Open Monday-Friday 8am-4pm ? ? ? Remember: ? Do not eat or drink after midnight the night before your surgery ?  ? Take these medicines the morning of surgery with A SIP OF WATER  ? ?metoprolol tartrate (LOPRESSOR)  ? ?Follow your surgeon's instructions on when to stop Aspirin and Plavix.  If no instructions were given by your surgeon then you will need to call the office to get those instructions.    ? ?As of today, STOP taking any Aleve, Naproxen, Ibuprofen, Motrin, Advil, Goody's, BC's, all herbal medications, fish oil, and all vitamins. ? ?WHAT DO I DO ABOUT MY DIABETES MEDICATION?  ? ? ?THE MORNING OF SURGERY, take 18  units of insulin detemir (LEVEMIR) insulin. ? ?The day of surgery, do not take other diabetes injectables, including Trulicity (dulaglutide). ? ?If your CBG is greater than 220 mg/dL, you may take ? of your sliding scale (correction) dose of insulin aspart (NOVOLOG FLEXPEN) insulin. ? ? ?HOW TO MANAGE YOUR DIABETES ?BEFORE AND AFTER SURGERY ? ?Why is it important to control my blood sugar before and after surgery? ?Improving blood sugar levels before and after surgery helps healing and can limit problems. ?A way of improving blood sugar control is eating a healthy diet by: ? Eating less sugar and carbohydrates ? Increasing activity/exercise ? Talking with your doctor about reaching your blood sugar goals ?High blood sugars (greater than 180 mg/dL) can raise your risk of infections and slow your recovery, so you will need to focus on controlling your diabetes during the weeks before surgery. ?Make sure that the doctor who takes care of your diabetes knows about your  planned surgery including the date and location. ? ?How do I manage my blood sugar before surgery? ?Check your blood sugar at least 4 times a day, starting 2 days before surgery, to make sure that the level is not too high or low. ? ?Check your blood sugar the morning of your surgery when you wake up and every 2 hours until you get to the Short Stay unit. ? ?If your blood sugar is less than 70 mg/dL, you will need to treat for low blood sugar: ?Do not take insulin. ?Treat a low blood sugar (less than 70 mg/dL) with ? cup of clear juice (cranberry or apple), 4 glucose tablets, OR glucose gel. ?Recheck blood sugar in 15 minutes after treatment (to make sure it is greater than 70 mg/dL). If your blood sugar is not greater than 70 mg/dL on recheck, call [phone] for further instructions. ?Report your blood sugar to the short stay nurse when you get to Short Stay. ? ?If you are admitted to the hospital after surgery: ?Your blood sugar will be checked by the staff and you will probably be given insulin after surgery (instead of oral diabetes medicines) to make sure you have good blood sugar levels. ?The goal for blood sugar control after surgery is 80-180 mg/dL. ? ?         ?           ?Do NOT Smoke (Tobacco/Vaping) for 24 hours prior to your procedure. ? ?If you use a CPAP at  night, you may bring your mask/headgear for your overnight stay. ?  ?Contacts, glasses, piercing's, hearing aid's, dentures or partials may not be worn into surgery, please bring cases for these belongings.  ?  ?For patients admitted to the hospital, discharge time will be determined by your treatment team. ?  ?Patients discharged the day of surgery will not be allowed to drive home, and someone needs to stay with them for 24 hours. ? ?SURGICAL WAITING ROOM VISITATION ?Patients having surgery or a procedure may have two support people in the waiting room. These visitors may be switched out with other visitors if needed. ?Children under the age  of 27 must have an adult accompany them who is not the patient. ?If the patient needs to stay at the hospital during part of their recovery, the visitor guidelines for inpatient rooms apply. ? ?Please refer to the [place] website for the visitor guidelines for Inpatients (after your surgery is over and you are in a regular room).  ? ? ?Special instructions:   ?[facility]- Preparing For Surgery ? ?Before surgery, you can play an important role. Because skin is not sterile, your skin needs to be as free of germs as possible. You can reduce the number of germs on your skin by washing with CHG (chlorahexidine gluconate) Soap before surgery.  CHG is an antiseptic cleaner which kills germs and bonds with the skin to continue killing germs even after washing.   ? ?Oral Hygiene is also important to reduce your risk of infection.  Remember - BRUSH YOUR TEETH THE MORNING OF SURGERY WITH YOUR REGULAR TOOTHPASTE ? ?Please do not use if you have an allergy to CHG or antibacterial soaps. If your skin becomes reddened/irritated stop using the CHG.  ?Do not shave (including legs and underarms) for at least 48 hours prior to first CHG shower. It is OK to shave your face. ? ?Please follow these instructions carefully. ?  ?Shower the NIGHT BEFORE SURGERY and the MORNING OF SURGERY ? ?If you chose to wash your hair, wash your hair first as usual with your normal shampoo. ? ?After you shampoo, rinse your hair and body thoroughly to remove the shampoo. ? ?Use CHG Soap as you would any other liquid soap. You can apply CHG directly to the skin and wash gently with a scrungie or a clean washcloth.  ? ?Apply the CHG Soap to your body ONLY FROM THE NECK DOWN.  Do not use on open wounds or open sores. Avoid contact with your eyes, ears, mouth and genitals (private parts). Wash Face and genitals (private parts)  with your normal soap.  ? ?Wash thoroughly, paying special attention to the area where your surgery will be  performed. ? ?Thoroughly rinse your body with warm water from the neck down. ? ?DO NOT shower/wash with your normal soap after using and rinsing off the CHG Soap. ? ?Pat yourself dry with a CLEAN TOWEL. ? ?Wear CLEAN PAJAMAS to bed the night before surgery ? ?Place CLEAN SHEETS on your bed the night before your surgery ? ?DO NOT SLEEP WITH PETS. ? ? ?Day of Surgery: ?Take a shower with CHG soap. ?Do not wear jewelry ?Do not wear lotions, powders, colognes, or deodorant. ?Men may shave face and neck. ?Do not bring valuables to the hospital.  ?[facility] is not responsible for any belongings or valuables. ?Wear Clean/Comfortable clothing the morning of surgery ?Remember to brush your teeth WITH YOUR REGULAR TOOTHPASTE. ?  ?Please read over the following  fact sheets that you were given. ? ? ? ?If you received a COVID test during your pre-op visit  it is requested that you wear a mask when out in public, stay away from anyone that may not be feeling well and notify your surgeon if you develop symptoms. If you have been in contact with anyone that has tested positive in the last 10 days please notify you surgeon. ? ?

## 2021-07-24 NOTE — Progress Notes (Addendum)
PCP - Cher Nakai, MD ?Cardiologist - Shirlee More, MD ? ?PPM/ICD - denies ?Device Orders - n/a ?Rep Notified - n/a ? ?Chest x-ray - n/a ?EKG - 07/18/2021 ?Stress Test - 04/13/2015 ?ECHO - denies ?Cardiac Cath - 04/24/2015 ? ?Sleep Study - patient denied having a sleep study done ?CPAP - n/a ? ?Fasting Blood Sugar - 140 ?Checks Blood Sugar 3 times a day ?CBG today - 162 ?A1C - done in PAT on 07/23/2021 ? ?Blood Thinner Instructions: Per patient, he will not stop taking Plavix even the day of surgery ? ?Aspirin Instructions: Patient will not stop taking Aspirin prior to surgery ? ?Patient was instructed: As of today, STOP taking any Aleve, Naproxen, Ibuprofen, Motrin, Advil, Goody's, BC's, all herbal medications, fish oil, and all vitamins. ? ?ERAS Protcol - n/a ? ?COVID TEST- n/a ? ? ?Anesthesia review: yes - cardiac history; cardiac clearance - 07/18/2021; Creatinine 2.55 in PAT (Dr. Donzetta Matters made aware) ? ?Patient denies shortness of breath, fever, cough and chest pain at PAT appointment ? ? ?All instructions explained to the patient, with a verbal understanding of the material. Patient agrees to go over the instructions while at home for a better understanding. Patient also instructed to self quarantine after being tested for COVID-19. The opportunity to ask questions was provided. ?  ?

## 2021-07-24 NOTE — Progress Notes (Addendum)
Abnormal Creatinine in PAT 2.55. Dr. Donzetta Matters office was called and a message was left to the nurse to call back PAT. Also, Dr. Donzetta Matters was notified via [place]. ?

## 2021-07-25 ENCOUNTER — Encounter (HOSPITAL_COMMUNITY): Payer: Self-pay

## 2021-07-25 NOTE — Anesthesia Preprocedure Evaluation (Addendum)
Anesthesia Evaluation  ?Patient identified by MRN, date of birth, ID band ?Patient awake ? ? ? ?Reviewed: ?Allergy & Precautions, NPO status , Patient's Chart, lab work & pertinent test results ? ?Airway ?Mallampati: II ? ?TM Distance: >3 FB ?Neck ROM: Full ? ? ? Dental ? ?(+) Dental Advisory Given ?  ?Pulmonary ?sleep apnea ,  ?  ?breath sounds clear to auscultation ? ? ? ? ? ? Cardiovascular ?hypertension, Pt. on medications and Pt. on home beta blockers ?+ CAD and + CABG  ?+ dysrhythmias  ?Rhythm:Regular Rate:Normal ? ? ?  ?Neuro/Psych ?negative neurological ROS ?   ? GI/Hepatic ?  ?Endo/Other  ?diabetes ? 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 ?  ?Musculoskeletal ? ? Abdominal ?  ?Peds ? Hematology ? ?(+) Blood dyscrasia, anemia ,   ?Anesthesia Other Findings ? ? Reproductive/Obstetrics ? ?  ? ? ? ? ? ? ? ? ? ? ? ? ? ?  ?  ? ? ? ? ? ? ? ?Lab Results  ?Component Value Date  ? WBC 7.4 07/24/2021  ? HGB 13.6 07/24/2021  ? HCT 41.8 07/24/2021  ? MCV 92.9 07/24/2021  ? PLT 174 07/24/2021  ? ?Lab Results  ?Component Value Date  ? CREATININE 2.55 (H) 07/24/2021  ? BUN 51 (H) 07/24/2021  ? NA 139 07/24/2021  ? K 4.3 07/24/2021  ? CL 106 07/24/2021  ? CO2 24 07/24/2021  ? ? ?Anesthesia Physical ?Anesthesia Plan ? ?ASA: 3 ? ?Anesthesia Plan: General  ? ?Post-op Pain Management: Tylenol PO (pre-op)* and Minimal or no pain anticipated  ? ?Induction: Intravenous ? ?PONV Risk Score and Plan: 2 and Dexamethasone, Ondansetron and Treatment may vary due to age or medical condition ? ?Airway Management Planned: Oral ETT ? ?Additional Equipment: Arterial line ? ?Intra-op Plan:  ? ?Post-operative Plan: Extubation in OR ? ?Informed Consent: I have reviewed the patients History and Physical, chart, labs and discussed the procedure including the risks, benefits and alternatives for the proposed anesthesia with the patient or authorized representative who has indicated his/her understanding and acceptance.   ? ? ? ?Dental advisory given ? ?Plan Discussed with:  ? ?Anesthesia Plan Comments: ( ?)  ? ? ? ? ? ?Anesthesia Quick Evaluation ? ?

## 2021-07-25 NOTE — Progress Notes (Addendum)
Anesthesia Chart Review: ? Case: [id number] Date/Time: 07/31/21 0715  ? Procedure: RIGHT ENDARTERECTOMY CAROTID (Right)  ? Anesthesia type: General  ? Pre-op diagnosis: Right Carotid Artery Stenosis  ? Location: [facility] ROOM 11 / [facility] OR  ? Surgeons: Waynetta Sandy, MD  ? ?  ? ? ?DISCUSSION: Patient is a 66 year old male scheduled for the above procedure. ? 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  BMI is consistent with morbid obesity. ? ?Last cardiology visit with Dr. Bettina Gavia was on 07/17/21 for follow-up and preoperative risk assessment. Patient's EKG showed Mobitz type I second-degree AV block, and b-blocker dose was reduced. He wrote: ?This surgery is intermediate risk CAD is stable exercise tolerance is greater then 5-7 METS and from a cardiovascular perspective he is optimized.  He has several risk factors for surgery including his insulin-dependent diabetes CKD and CAD however I do not feel he requires any preoperative cardiac diagnostic testing.  He tells me he will be admitted overnight please place him in a cardiac monitored bed and do an EKG postoperative day 1 please contact heart care for any concerns.  He is having Mobitz 1 second-degree AV block and I have reduced his beta-blocker dosage. ?Stable CAD following surgical revascularization having no angina normal exercise tolerance continue his usual cardiac medications.  If needed clopidogrel could be withdrawn in the perioperative... ?  ?Preoperative labs show a BUN 51, Creatinine 2.55, eGFR 27. GFR is in the CKD stage IV range. No comparison labs currently available, but 09/2020 primary care records indicate he has known CKD 3b. 11/10/20 office note by Dr. Bettina Gavia also indicates Creatinine then was 2.8. He had a normal appearing  renal US done in 11/2020 ordered by Dr. Johnney Ou, so latest nephrology records requested from [facility].  (UPDATE 07/25/21 3:20 PM: Received 05/01/21 nephrology note from Dr. Johnney Ou for follow-up CKD stage 3b. 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 ACEi and HCTZ discontinued due to hypotension, and he was advised to hydrate. There was some improvement in his renal function since then with Creatinine more recently ~ 2.5-3.0, GFR 22-24 which she thought may be his new baseline. (05/01/21 labs BUN 52, Creatinine 2.70, eGFR 25.) She advised avoid nephrotoxins and maintain hydration.) ? ?He reported instructions to continue ASA and Plavix. Anesthesia team to evaluate on the day of surgery.  ? ? ?VS: BP 117/66   Pulse 92   Temp 36.4 ?C   Resp 19   Ht 6' (1.829 m)   Wt (!) 137.9 kg   SpO2 99%   BMI 41.23 kg/m?  ? ? ?PROVIDERS: ?Cher Nakai, MD is PCP ([place], [phone]) ?Shirlee More, MD is cardiologist.  Last visit 07/17/2021 with 6 months follow-up planned. ?- Shamleffer, Mammie Lorenzo, MD is endocrinologist.  Last visit 06/22/2021. ?- Jannifer Hick, MD is nephrologist ? ? ?LABS: Preoperative labs noted.  Awaiting records from [facility] but based on notes in [facility] his creatinine is likely stable. ?(all labs ordered are listed, but only abnormal results are displayed) ? ?Labs Reviewed  ?SURGICAL PCR SCREEN - Abnormal; Notable for the following components:  ?    Result Value  ? Staphylococcus aureus POSITIVE (*)   ?  All other components within normal limits  ?GLUCOSE, CAPILLARY - Abnormal; Notable for the following components:  ? Glucose-Capillary 162 (*)   ? All other components within normal limits  ?COMPREHENSIVE METABOLIC PANEL - Abnormal; Notable for the following components:  ? Glucose, Bld 158 (*)   ? BUN 51 (*)   ? Creatinine, Ser 2.55 (*)   ? GFR,  Estimated 27 (*)   ? All other components within normal limits  ?URINALYSIS, ROUTINE W REFLEX MICROSCOPIC - Abnormal; Notable for the following components:  ? Color, Urine STRAW (*)   ? All other components within normal limits  ?HEMOGLOBIN A1C - Abnormal; Notable for the following components:  ? Hgb A1c MFr Bld 7.0 (*)   ? All other components within normal limits  ?CBC  ?PROTIME-INR  ?APTT  ?TYPE AND SCREEN  ? ? ? ?IMAGES: ?US Renal 11/22/20: ?IMPRESSION: ?Normal sonographic appearance of the kidneys and urinary bladder. ?Specifically, no hydronephrosis. ?  ? ?EKG: 07/17/21 ([facility]): ?Ventricular rate 54 bpm ?Sinus rhythm with second-degree AV block (Mobitz 1) ?Left axis deviation ?Nonspecific intraventricular block ? ? ?CV: ?US Carotid 06/29/21: ?Summary:  ?- Right Carotid: Velocities in the right ICA are consistent with a 80-99%  ?               stenosis.  ?- Left Carotid: Velocities in the left ICA are consistent with a 1-39%  ?stenosis.  ?- Vertebrals:  Bilateral vertebral arteries demonstrate antegrade flow.  ?- Subclavians: Normal flow hemodynamics were seen in bilateral subclavian  ?             arteries.  ?- CTA neck was not done due to decreased renal function. ? ? ?Cardiac cath 04/24/15 [facility]): ?Conclusions: ?1. Severe LAD/RCA lesions  ?2. Cir with mild disease  ?3. All grafts are widely patent (LIMA to LAD, SVG to RCA, SVG to Diag )  ?4. LVEDP 12, ventriculogram was not performed due to renal insuff. Normal by  ?recent non-invasive test.  ?Diagnostic Procedure Recommendations  ?Medical treatment.  ? ? ?Echo 04/04/15 [facility], scanned under Media tab): ?Conclusions: ?1.  This was a technically difficult study with suboptimal views.  Definity was used for LV wall enhancement. ?2.  The left ventricular size is normal.  EF 65.66%. ?3.  There is mild concentric LVH. ?4.  The diastolic filling pattern indicates impaired relaxation. ?5.  The left atrium is mildly dilated. ? ? ?Past Medical  History:  ?Diagnosis Date  ? Allergic rhinitis   ? Anemia   ? Benign essential hypertension   ? CAD (coronary artery disease)   ? CKD (chronic kidney disease), symptom management only, stage 3 (moderate) (HCC)   ? Dyslipidemia 03/24/2015  ? ED (erectile dysfunction)   ? Hyperlipidemia   ? Morbid obesity due to excess calories ([place]) 03/24/2015  ? Obstructive sleep apnea 03/24/2015  ? patient denied having a sleep study done  ? Renal insufficiency   ? 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 Formatting of this note might be different from the original. LIMA to LAD, SVG to diagonal 1, SVG to PDA  ? Tinnitus, bilateral   ? Type 2 diabetes mellitus without complication ([place]) 26/33/3545  ? ? ?Past Surgical History:  ?Procedure Laterality Date  ? ADENOIDECTOMY    ? CARDIAC CATHETERIZATION    ? CARDIAC SURGERY    ? Tripple bypass  ? COLONOSCOPY    ? ? ?MEDICATIONS: ? amLODipine (NORVASC) 2.5 MG tablet  ? aspirin 81  MG EC tablet  ? augmented betamethasone dipropionate (DIPROLENE-AF) 0.05 % cream  ? clopidogrel (PLAVIX) 75 MG tablet  ? Dulaglutide (TRULICITY) 4.5 JS/2.8BT SOPN  ? Evolocumab 140 MG/ML SOAJ  ? insulin aspart (NOVOLOG FLEXPEN) 100 UNIT/ML FlexPen  ? insulin detemir (LEVEMIR) 100 UNIT/ML FlexPen  ? Insulin Pen Needle 29G X 5MM MISC  ? metoprolol tartrate (LOPRESSOR) 25 MG tablet  ? Omega-3 Fatty Acids (FISH OIL) 1000 MG CAPS  ? tamsulosin (FLOMAX) 0.4 MG CAPS capsule  ? ?No current facility-administered medications for this encounter.  ? ? ?Myra Gianotti, PA-C ?Surgical Short Stay/Anesthesiology ?[facility] Phone [phone] ?[facility] Phone [phone] ?07/25/2021 2:01 PM ? ? ? ? ? ? ?

## 2021-07-31 ENCOUNTER — Inpatient Hospital Stay (HOSPITAL_COMMUNITY): Payer: Medicare HMO | Admitting: Anesthesiology

## 2021-07-31 ENCOUNTER — Encounter (HOSPITAL_COMMUNITY)
Admission: RE | Disposition: A | Discharge status: Home / Self Care | Payer: Self-pay | Source: Ambulatory Visit | Attending: Vascular Surgery

## 2021-07-31 ENCOUNTER — Inpatient Hospital Stay (HOSPITAL_COMMUNITY): Payer: Medicare HMO | Admitting: Vascular Surgery

## 2021-07-31 ENCOUNTER — Inpatient Hospital Stay (HOSPITAL_COMMUNITY): Payer: Medicare HMO

## 2021-07-31 ENCOUNTER — Inpatient Hospital Stay (HOSPITAL_COMMUNITY)
Admission: RE | Admit: 2021-07-31 | Discharge: 2021-08-03 | DRG: 037 | Disposition: A | Discharge status: Home / Self Care | Payer: Medicare HMO | Source: Ambulatory Visit | Attending: Vascular Surgery | Admitting: Vascular Surgery

## 2021-07-31 ENCOUNTER — Inpatient Hospital Stay (HOSPITAL_COMMUNITY): Payer: Medicare HMO | Admitting: Certified Registered Nurse Anesthetist

## 2021-07-31 ENCOUNTER — Other Ambulatory Visit: Payer: Self-pay

## 2021-07-31 ENCOUNTER — Encounter (HOSPITAL_COMMUNITY): Payer: Self-pay | Admitting: Vascular Surgery

## 2021-07-31 DIAGNOSIS — N183 Chronic kidney disease, stage 3 unspecified: Secondary | ICD-10-CM | POA: Diagnosis not present

## 2021-07-31 DIAGNOSIS — S1093XA Contusion of unspecified part of neck, initial encounter: Secondary | ICD-10-CM | POA: Diagnosis not present

## 2021-07-31 DIAGNOSIS — I97638 Postprocedural hematoma of a circulatory system organ or structure following other circulatory system procedure: Secondary | ICD-10-CM | POA: Diagnosis not present

## 2021-07-31 DIAGNOSIS — I6529 Occlusion and stenosis of unspecified carotid artery: Secondary | ICD-10-CM

## 2021-07-31 DIAGNOSIS — I6521 Occlusion and stenosis of right carotid artery: Principal | ICD-10-CM | POA: Diagnosis present

## 2021-07-31 DIAGNOSIS — I129 Hypertensive chronic kidney disease with stage 1 through stage 4 chronic kidney disease, or unspecified chronic kidney disease: Secondary | ICD-10-CM

## 2021-07-31 DIAGNOSIS — Z87891 Personal history of nicotine dependence: Secondary | ICD-10-CM

## 2021-07-31 DIAGNOSIS — N189 Chronic kidney disease, unspecified: Secondary | ICD-10-CM | POA: Diagnosis not present

## 2021-07-31 DIAGNOSIS — I441 Atrioventricular block, second degree: Secondary | ICD-10-CM | POA: Diagnosis not present

## 2021-07-31 DIAGNOSIS — R001 Bradycardia, unspecified: Secondary | ICD-10-CM | POA: Diagnosis not present

## 2021-07-31 DIAGNOSIS — Y838 Other surgical procedures as the cause of abnormal reaction of the patient, or of later complication, without mention of misadventure at the time of the procedure: Secondary | ICD-10-CM | POA: Diagnosis not present

## 2021-07-31 DIAGNOSIS — I2510 Atherosclerotic heart disease of native coronary artery without angina pectoris: Secondary | ICD-10-CM | POA: Diagnosis present

## 2021-07-31 DIAGNOSIS — N1832 Chronic kidney disease, stage 3b: Secondary | ICD-10-CM | POA: Diagnosis not present

## 2021-07-31 DIAGNOSIS — J95821 Acute postprocedural respiratory failure: Secondary | ICD-10-CM | POA: Diagnosis not present

## 2021-07-31 DIAGNOSIS — Z7902 Long term (current) use of antithrombotics/antiplatelets: Secondary | ICD-10-CM

## 2021-07-31 DIAGNOSIS — E1122 Type 2 diabetes mellitus with diabetic chronic kidney disease: Secondary | ICD-10-CM | POA: Diagnosis not present

## 2021-07-31 DIAGNOSIS — Z6841 Body Mass Index (BMI) 40.0 and over, adult: Secondary | ICD-10-CM

## 2021-07-31 DIAGNOSIS — Y713 Surgical instruments, materials and cardiovascular devices (including sutures) associated with adverse incidents: Secondary | ICD-10-CM | POA: Diagnosis not present

## 2021-07-31 DIAGNOSIS — G4733 Obstructive sleep apnea (adult) (pediatric): Secondary | ICD-10-CM | POA: Diagnosis present

## 2021-07-31 DIAGNOSIS — Z7985 Long-term (current) use of injectable non-insulin antidiabetic drugs: Secondary | ICD-10-CM

## 2021-07-31 DIAGNOSIS — I428 Other cardiomyopathies: Secondary | ICD-10-CM | POA: Diagnosis not present

## 2021-07-31 DIAGNOSIS — Z833 Family history of diabetes mellitus: Secondary | ICD-10-CM

## 2021-07-31 DIAGNOSIS — D631 Anemia in chronic kidney disease: Secondary | ICD-10-CM | POA: Diagnosis not present

## 2021-07-31 DIAGNOSIS — Z951 Presence of aortocoronary bypass graft: Secondary | ICD-10-CM | POA: Diagnosis not present

## 2021-07-31 DIAGNOSIS — J9601 Acute respiratory failure with hypoxia: Secondary | ICD-10-CM | POA: Diagnosis not present

## 2021-07-31 DIAGNOSIS — Z7982 Long term (current) use of aspirin: Secondary | ICD-10-CM | POA: Diagnosis not present

## 2021-07-31 DIAGNOSIS — Z820 Family history of epilepsy and other diseases of the nervous system: Secondary | ICD-10-CM | POA: Diagnosis not present

## 2021-07-31 DIAGNOSIS — L7632 Postprocedural hematoma of skin and subcutaneous tissue following other procedure: Secondary | ICD-10-CM

## 2021-07-31 DIAGNOSIS — Z79899 Other long term (current) drug therapy: Secondary | ICD-10-CM | POA: Diagnosis not present

## 2021-07-31 DIAGNOSIS — I440 Atrioventricular block, first degree: Secondary | ICD-10-CM | POA: Diagnosis not present

## 2021-07-31 DIAGNOSIS — E785 Hyperlipidemia, unspecified: Secondary | ICD-10-CM | POA: Diagnosis present

## 2021-07-31 DIAGNOSIS — Z8249 Family history of ischemic heart disease and other diseases of the circulatory system: Secondary | ICD-10-CM

## 2021-07-31 DIAGNOSIS — J9600 Acute respiratory failure, unspecified whether with hypoxia or hypercapnia: Secondary | ICD-10-CM

## 2021-07-31 DIAGNOSIS — Z794 Long term (current) use of insulin: Secondary | ICD-10-CM

## 2021-07-31 DIAGNOSIS — J9811 Atelectasis: Secondary | ICD-10-CM | POA: Diagnosis not present

## 2021-07-31 HISTORY — DX: Occlusion and stenosis of unspecified carotid artery: I65.29

## 2021-07-31 HISTORY — PX: ENDARTERECTOMY: SHX5162

## 2021-07-31 LAB — POCT I-STAT, CHEM 8
BUN: 48 mg/dL — ABNORMAL HIGH (ref 8–23)
Calcium, Ion: 1.21 mmol/L (ref 1.15–1.40)
Chloride: 109 mmol/L (ref 98–111)
Creatinine, Ser: 2.4 mg/dL — ABNORMAL HIGH (ref 0.61–1.24)
Glucose, Bld: 162 mg/dL — ABNORMAL HIGH (ref 70–99)
HCT: 35 % — ABNORMAL LOW (ref 39.0–52.0)
Hemoglobin: 11.9 g/dL — ABNORMAL LOW (ref 13.0–17.0)
Potassium: 4.6 mmol/L (ref 3.5–5.1)
Sodium: 142 mmol/L (ref 135–145)
TCO2: 25 mmol/L (ref 22–32)

## 2021-07-31 LAB — CBC
HCT: 34.7 % — ABNORMAL LOW (ref 39.0–52.0)
Hemoglobin: 11.2 g/dL — ABNORMAL LOW (ref 13.0–17.0)
MCH: 30.4 pg (ref 26.0–34.0)
MCHC: 32.3 g/dL (ref 30.0–36.0)
MCV: 94 fL (ref 80.0–100.0)
Platelets: 175 10*3/uL (ref 150–400)
RBC: 3.69 MIL/uL — ABNORMAL LOW (ref 4.22–5.81)
RDW: 13.8 % (ref 11.5–15.5)
WBC: 9 10*3/uL (ref 4.0–10.5)
nRBC: 0 % (ref 0.0–0.2)

## 2021-07-31 LAB — POCT I-STAT 7, (LYTES, BLD GAS, ICA,H+H)
Acid-base deficit: 4 mmol/L — ABNORMAL HIGH (ref 0.0–2.0)
Bicarbonate: 21.1 mmol/L (ref 20.0–28.0)
Calcium, Ion: 1.2 mmol/L (ref 1.15–1.40)
HCT: 34 % — ABNORMAL LOW (ref 39.0–52.0)
Hemoglobin: 11.6 g/dL — ABNORMAL LOW (ref 13.0–17.0)
O2 Saturation: 95 %
Patient temperature: 97.6
Potassium: 4.8 mmol/L (ref 3.5–5.1)
Sodium: 140 mmol/L (ref 135–145)
TCO2: 22 mmol/L (ref 22–32)
pCO2 arterial: 37.5 mmHg (ref 32–48)
pH, Arterial: 7.355 (ref 7.35–7.45)
pO2, Arterial: 79 mmHg — ABNORMAL LOW (ref 83–108)

## 2021-07-31 LAB — MAGNESIUM: Magnesium: 1.8 mg/dL (ref 1.7–2.4)

## 2021-07-31 LAB — BASIC METABOLIC PANEL
Anion gap: 8 (ref 5–15)
BUN: 47 mg/dL — ABNORMAL HIGH (ref 8–23)
CO2: 20 mmol/L — ABNORMAL LOW (ref 22–32)
Calcium: 8.4 mg/dL — ABNORMAL LOW (ref 8.9–10.3)
Chloride: 110 mmol/L (ref 98–111)
Creatinine, Ser: 2.25 mg/dL — ABNORMAL HIGH (ref 0.61–1.24)
GFR, Estimated: 31 mL/min — ABNORMAL LOW (ref >60)
Glucose, Bld: 212 mg/dL — ABNORMAL HIGH (ref 70–99)
Potassium: 4.7 mmol/L (ref 3.5–5.1)
Sodium: 138 mmol/L (ref 135–145)

## 2021-07-31 LAB — GLUCOSE, CAPILLARY
Glucose-Capillary: 190 mg/dL — ABNORMAL HIGH (ref 70–99)
Glucose-Capillary: 158 mg/dL — ABNORMAL HIGH (ref 70–99)
Glucose-Capillary: 196 mg/dL — ABNORMAL HIGH (ref 70–99)
Glucose-Capillary: 225 mg/dL — ABNORMAL HIGH (ref 70–99)
Glucose-Capillary: 229 mg/dL — ABNORMAL HIGH (ref 70–99)

## 2021-07-31 LAB — POCT ACTIVATED CLOTTING TIME
Activated Clotting Time: 209 seconds
Activated Clotting Time: 281 seconds

## 2021-07-31 LAB — ABO/RH: ABO/RH(D): A POS

## 2021-07-31 SURGERY — ENDARTERECTOMY, CAROTID
Anesthesia: General | Site: Neck | Laterality: Right

## 2021-07-31 MED ORDER — EPHEDRINE SULFATE-NACL 50-0.9 MG/10ML-% IV SOSY
PREFILLED_SYRINGE | INTRAVENOUS | Status: DC | PRN
  Administered 2021-07-31: 5 mg via INTRAVENOUS

## 2021-07-31 MED ORDER — LACTATED RINGERS IV SOLN: INTRAVENOUS | Status: DC

## 2021-07-31 MED ORDER — PROPOFOL 10 MG/ML IV BOLUS
INTRAVENOUS | Status: AC
  Filled 2021-07-31: qty 20

## 2021-07-31 MED ORDER — CHLORHEXIDINE GLUCONATE CLOTH 2 % EX PADS
6.0000 | MEDICATED_PAD | Freq: Every day | CUTANEOUS | Status: DC
  Administered 2021-07-31 – 2021-08-03 (×4): 6 via TOPICAL

## 2021-07-31 MED ORDER — FENTANYL CITRATE (PF) 250 MCG/5ML IJ SOLN
INTRAMUSCULAR | Status: AC
  Filled 2021-07-31: qty 5

## 2021-07-31 MED ORDER — ORAL CARE MOUTH RINSE
15.0000 mL | OROMUCOSAL | Status: DC
  Administered 2021-07-31 – 2021-08-01 (×4): 15 mL via OROMUCOSAL

## 2021-07-31 MED ORDER — GLYCOPYRROLATE PF 0.2 MG/ML IJ SOSY
PREFILLED_SYRINGE | INTRAMUSCULAR | Status: DC | PRN
  Administered 2021-07-31 (×2): .1 mg via INTRAVENOUS

## 2021-07-31 MED ORDER — LABETALOL HCL 5 MG/ML IV SOLN: 10.0000 mg | INTRAVENOUS | Status: DC | PRN

## 2021-07-31 MED ORDER — PROPOFOL 1000 MG/100ML IV EMUL
0.0000 ug/kg/min | INTRAVENOUS | Status: DC
  Administered 2021-07-31: 50 ug/kg/min via INTRAVENOUS
  Administered 2021-07-31: 30 ug/kg/min via INTRAVENOUS
  Administered 2021-08-01: 25 ug/kg/min via INTRAVENOUS
  Administered 2021-08-01: 20 ug/kg/min via INTRAVENOUS
  Filled 2021-07-31 (×5): qty 100

## 2021-07-31 MED ORDER — DEXTRAN 40 IN SALINE 10-0.9 % IV SOLN
INTRAVENOUS | Status: AC | PRN
  Administered 2021-07-31: 500 mL

## 2021-07-31 MED ORDER — PHENYLEPHRINE HCL-NACL 20-0.9 MG/250ML-% IV SOLN
INTRAVENOUS | Status: DC | PRN
  Administered 2021-07-31: 25 ug/min via INTRAVENOUS

## 2021-07-31 MED ORDER — HYDRALAZINE HCL 20 MG/ML IJ SOLN
INTRAMUSCULAR | Status: AC
  Filled 2021-07-31: qty 1

## 2021-07-31 MED ORDER — LACTATED RINGERS IV SOLN: INTRAVENOUS | Status: DC | PRN

## 2021-07-31 MED ORDER — ACETAMINOPHEN 500 MG PO TABS
1000.0000 mg | ORAL_TABLET | Freq: Once | ORAL | Status: AC
  Administered 2021-07-31: 1000 mg via ORAL
  Filled 2021-07-31: qty 2

## 2021-07-31 MED ORDER — ALUM & MAG HYDROXIDE-SIMETH 200-200-20 MG/5ML PO SUSP: 15.0000 mL | ORAL | Status: DC | PRN

## 2021-07-31 MED ORDER — HEPARIN 6000 UNIT IRRIGATION SOLUTION
Status: DC | PRN
  Administered 2021-07-31: 1

## 2021-07-31 MED ORDER — LIDOCAINE 2% (20 MG/ML) 5 ML SYRINGE
INTRAMUSCULAR | Status: DC | PRN
  Administered 2021-07-31: 80 mg via INTRAVENOUS

## 2021-07-31 MED ORDER — DEXTROSE 5 % IV SOLN
INTRAVENOUS | Status: DC | PRN
  Administered 2021-07-31: 3 g via INTRAVENOUS

## 2021-07-31 MED ORDER — ORAL CARE MOUTH RINSE
15.0000 mL | OROMUCOSAL | Status: DC
  Administered 2021-07-31 – 2021-08-01 (×6): 15 mL via OROMUCOSAL

## 2021-07-31 MED ORDER — METOPROLOL TARTRATE 25 MG PO TABS: 25.0000 mg | ORAL_TABLET | Freq: Every day | ORAL | Status: DC

## 2021-07-31 MED ORDER — HEPARIN SODIUM (PORCINE) 1000 UNIT/ML IJ SOLN
INTRAMUSCULAR | Status: DC | PRN
  Administered 2021-07-31: 5000 [IU] via INTRAVENOUS
  Administered 2021-07-31: 13000 [IU] via INTRAVENOUS

## 2021-07-31 MED ORDER — SUGAMMADEX SODIUM 500 MG/5ML IV SOLN
INTRAVENOUS | Status: AC
  Filled 2021-07-31: qty 5

## 2021-07-31 MED ORDER — AMLODIPINE BESYLATE 5 MG PO TABS: 2.5000 mg | ORAL_TABLET | Freq: Every day | ORAL | Status: DC

## 2021-07-31 MED ORDER — PANTOPRAZOLE 2 MG/ML SUSPENSION: 40.0000 mg | Freq: Every day | ORAL | Status: DC

## 2021-07-31 MED ORDER — SODIUM CHLORIDE 0.9 % IV SOLN: INTRAVENOUS | Status: AC

## 2021-07-31 MED ORDER — CEFAZOLIN SODIUM-DEXTROSE 2-4 GM/100ML-% IV SOLN
2.0000 g | Freq: Three times a day (TID) | INTRAVENOUS | Status: AC
  Administered 2021-07-31 – 2021-08-01 (×2): 2 g via INTRAVENOUS
  Filled 2021-07-31 (×2): qty 100

## 2021-07-31 MED ORDER — EPHEDRINE SULFATE-NACL 50-0.9 MG/10ML-% IV SOSY
PREFILLED_SYRINGE | INTRAVENOUS | Status: DC | PRN
  Administered 2021-07-31: 2.5 mg via INTRAVENOUS
  Administered 2021-07-31: 5 mg via INTRAVENOUS

## 2021-07-31 MED ORDER — LIDOCAINE HCL (PF) 1 % IJ SOLN
INTRAMUSCULAR | Status: AC
  Filled 2021-07-31: qty 5

## 2021-07-31 MED ORDER — GLYCOPYRROLATE PF 0.2 MG/ML IJ SOSY
PREFILLED_SYRINGE | INTRAMUSCULAR | Status: AC
  Filled 2021-07-31: qty 1

## 2021-07-31 MED ORDER — ACETAMINOPHEN 325 MG PO TABS: 325.0000 mg | ORAL_TABLET | ORAL | Status: DC | PRN

## 2021-07-31 MED ORDER — INSULIN ASPART 100 UNIT/ML IJ SOLN
0.0000 [IU] | INTRAMUSCULAR | Status: AC | PRN
  Administered 2021-07-31: 4 [IU] via SUBCUTANEOUS
  Administered 2021-07-31: 2 [IU] via SUBCUTANEOUS

## 2021-07-31 MED ORDER — HYDRALAZINE HCL 20 MG/ML IJ SOLN
10.0000 mg | Freq: Four times a day (QID) | INTRAMUSCULAR | Status: DC | PRN
  Administered 2021-07-31: 10 mg via INTRAVENOUS

## 2021-07-31 MED ORDER — PROTAMINE SULFATE 10 MG/ML IV SOLN
INTRAVENOUS | Status: AC
  Filled 2021-07-31: qty 5

## 2021-07-31 MED ORDER — TAMSULOSIN HCL 0.4 MG PO CAPS: 0.4000 mg | ORAL_CAPSULE | Freq: Every day | ORAL | Status: DC

## 2021-07-31 MED ORDER — FENTANYL BOLUS VIA INFUSION
25.0000 ug | INTRAVENOUS | Status: DC | PRN
  Administered 2021-08-01: 50 ug via INTRAVENOUS
  Filled 2021-07-31: qty 100

## 2021-07-31 MED ORDER — INSULIN DETEMIR 100 UNIT/ML ~~LOC~~ SOLN
36.0000 [IU] | Freq: Every day | SUBCUTANEOUS | Status: DC
  Administered 2021-08-01 – 2021-08-03 (×3): 36 [IU] via SUBCUTANEOUS
  Filled 2021-07-31 (×3): qty 0.36

## 2021-07-31 MED ORDER — PROPOFOL 10 MG/ML IV BOLUS
INTRAVENOUS | Status: DC | PRN
  Administered 2021-07-31: 170 mg via INTRAVENOUS

## 2021-07-31 MED ORDER — FENTANYL CITRATE PF 50 MCG/ML IJ SOSY: 25.0000 ug | PREFILLED_SYRINGE | Freq: Once | INTRAMUSCULAR | Status: DC

## 2021-07-31 MED ORDER — ROCURONIUM BROMIDE 10 MG/ML (PF) SYRINGE
PREFILLED_SYRINGE | INTRAVENOUS | Status: DC | PRN
  Administered 2021-07-31: 20 mg via INTRAVENOUS
  Administered 2021-07-31 (×2): 50 mg via INTRAVENOUS

## 2021-07-31 MED ORDER — ATROPINE SULFATE 1 MG/10ML IJ SOSY
PREFILLED_SYRINGE | INTRAMUSCULAR | Status: AC
  Filled 2021-07-31: qty 10

## 2021-07-31 MED ORDER — INSULIN ASPART 100 UNIT/ML IJ SOLN: 0.0000 [IU] | Freq: Three times a day (TID) | INTRAMUSCULAR | Status: DC

## 2021-07-31 MED ORDER — FENTANYL CITRATE (PF) 100 MCG/2ML IJ SOLN
INTRAMUSCULAR | Status: AC
  Filled 2021-07-31: qty 2

## 2021-07-31 MED ORDER — MORPHINE SULFATE (PF) 2 MG/ML IV SOLN
2.0000 mg | INTRAVENOUS | Status: DC | PRN
  Administered 2021-07-31: 2 mg via INTRAVENOUS
  Filled 2021-07-31: qty 1

## 2021-07-31 MED ORDER — PROPOFOL 500 MG/50ML IV EMUL
INTRAVENOUS | Status: DC | PRN
  Administered 2021-07-31: 100 ug/kg/min via INTRAVENOUS

## 2021-07-31 MED ORDER — HYDRALAZINE HCL 20 MG/ML IJ SOLN
10.0000 mg | Freq: Once | INTRAMUSCULAR | Status: AC
  Administered 2021-07-31: 10 mg via INTRAVENOUS

## 2021-07-31 MED ORDER — SODIUM CHLORIDE 0.9 % IV SOLN
0.1500 ug/kg/min | INTRAVENOUS | Status: DC
  Administered 2021-07-31: .2 ug/kg/min via INTRAVENOUS
  Filled 2021-07-31: qty 5000

## 2021-07-31 MED ORDER — SODIUM CHLORIDE 0.9 % IV SOLN: INTRAVENOUS | Status: DC

## 2021-07-31 MED ORDER — FENTANYL CITRATE (PF) 100 MCG/2ML IJ SOLN
25.0000 ug | INTRAMUSCULAR | Status: DC | PRN
  Administered 2021-07-31 (×3): 50 ug via INTRAVENOUS

## 2021-07-31 MED ORDER — PHENYLEPHRINE HCL-NACL 20-0.9 MG/250ML-% IV SOLN
INTRAVENOUS | Status: DC | PRN
  Administered 2021-07-31: 30 ug/min via INTRAVENOUS

## 2021-07-31 MED ORDER — FENTANYL CITRATE (PF) 250 MCG/5ML IJ SOLN
INTRAMUSCULAR | Status: DC | PRN
Start: 2021-07-31 — End: 2021-07-31
  Administered 2021-07-31 (×3): 50 ug via INTRAVENOUS

## 2021-07-31 MED ORDER — HEMOSTATIC AGENTS (NO CHARGE) OPTIME
TOPICAL | Status: DC | PRN
  Administered 2021-07-31: 1 via TOPICAL

## 2021-07-31 MED ORDER — DEXAMETHASONE SODIUM PHOSPHATE 10 MG/ML IJ SOLN
INTRAMUSCULAR | Status: DC | PRN
  Administered 2021-07-31: 5 mg via INTRAVENOUS

## 2021-07-31 MED ORDER — CHLORHEXIDINE GLUCONATE 0.12% ORAL RINSE (MEDLINE KIT)
15.0000 mL | Freq: Two times a day (BID) | OROMUCOSAL | Status: DC
  Administered 2021-07-31: 15 mL via OROMUCOSAL

## 2021-07-31 MED ORDER — HEPARIN 6000 UNIT IRRIGATION SOLUTION
Status: AC
  Filled 2021-07-31: qty 500

## 2021-07-31 MED ORDER — SUGAMMADEX SODIUM 500 MG/5ML IV SOLN
INTRAVENOUS | Status: DC | PRN
  Administered 2021-07-31: 300 mg via INTRAVENOUS

## 2021-07-31 MED ORDER — CHLORHEXIDINE GLUCONATE 0.12% ORAL RINSE (MEDLINE KIT)
15.0000 mL | Freq: Two times a day (BID) | OROMUCOSAL | Status: DC
  Administered 2021-07-31 – 2021-08-01 (×2): 15 mL via OROMUCOSAL

## 2021-07-31 MED ORDER — CLEVIDIPINE BUTYRATE 0.5 MG/ML IV EMUL: 0.0000 mg/h | INTRAVENOUS | Status: DC

## 2021-07-31 MED ORDER — ACETAMINOPHEN 325 MG RE SUPP: 325.0000 mg | RECTAL | Status: DC | PRN

## 2021-07-31 MED ORDER — DOCUSATE SODIUM 50 MG/5ML PO LIQD: 100.0000 mg | Freq: Two times a day (BID) | ORAL | Status: DC

## 2021-07-31 MED ORDER — ONDANSETRON HCL 4 MG/2ML IJ SOLN: 4.0000 mg | Freq: Four times a day (QID) | INTRAMUSCULAR | Status: DC | PRN

## 2021-07-31 MED ORDER — INSULIN ASPART 100 UNIT/ML IJ SOLN
INTRAMUSCULAR | Status: DC | PRN
  Administered 2021-07-31: 4 [IU] via SUBCUTANEOUS

## 2021-07-31 MED ORDER — ONDANSETRON HCL 4 MG/2ML IJ SOLN
INTRAMUSCULAR | Status: DC | PRN
  Administered 2021-07-31: 4 mg via INTRAVENOUS

## 2021-07-31 MED ORDER — CHLORHEXIDINE GLUCONATE CLOTH 2 % EX PADS: 6.0000 | MEDICATED_PAD | Freq: Once | CUTANEOUS | Status: DC

## 2021-07-31 MED ORDER — OXYCODONE-ACETAMINOPHEN 5-325 MG PO TABS
1.0000 | ORAL_TABLET | ORAL | Status: DC | PRN
  Filled 2021-07-31: qty 2

## 2021-07-31 MED ORDER — BISACODYL 5 MG PO TBEC: 10.0000 mg | DELAYED_RELEASE_TABLET | Freq: Every day | ORAL | Status: DC | PRN

## 2021-07-31 MED ORDER — ASPIRIN 81 MG PO CHEW: 81.0000 mg | CHEWABLE_TABLET | Freq: Every day | ORAL | Status: DC

## 2021-07-31 MED ORDER — LIDOCAINE 2% (20 MG/ML) 5 ML SYRINGE
INTRAMUSCULAR | Status: AC
  Filled 2021-07-31: qty 5

## 2021-07-31 MED ORDER — ONDANSETRON HCL 4 MG/2ML IJ SOLN
INTRAMUSCULAR | Status: AC
  Filled 2021-07-31: qty 2

## 2021-07-31 MED ORDER — PHENOL 1.4 % MT LIQD
1.0000 | OROMUCOSAL | Status: DC | PRN
  Administered 2021-08-03: 1 via OROMUCOSAL
  Filled 2021-07-31 (×2): qty 177

## 2021-07-31 MED ORDER — GUAIFENESIN-DM 100-10 MG/5ML PO SYRP: 15.0000 mL | ORAL_SOLUTION | ORAL | Status: DC | PRN

## 2021-07-31 MED ORDER — POTASSIUM CHLORIDE CRYS ER 20 MEQ PO TBCR: 20.0000 meq | EXTENDED_RELEASE_TABLET | Freq: Every day | ORAL | Status: DC | PRN

## 2021-07-31 MED ORDER — AMISULPRIDE (ANTIEMETIC) 5 MG/2ML IV SOLN: 10.0000 mg | Freq: Once | INTRAVENOUS | Status: DC | PRN

## 2021-07-31 MED ORDER — ROCURONIUM BROMIDE 10 MG/ML (PF) SYRINGE
PREFILLED_SYRINGE | INTRAVENOUS | Status: AC
  Filled 2021-07-31: qty 10

## 2021-07-31 MED ORDER — ASPIRIN 81 MG PO TBEC
81.0000 mg | DELAYED_RELEASE_TABLET | Freq: Every day | ORAL | Status: DC
Start: 2021-07-31 — End: 2021-07-31

## 2021-07-31 MED ORDER — SODIUM CHLORIDE 0.9 % IV SOLN: 500.0000 mL | Freq: Once | INTRAVENOUS | Status: DC | PRN

## 2021-07-31 MED ORDER — FENTANYL CITRATE (PF) 250 MCG/5ML IJ SOLN
INTRAMUSCULAR | Status: DC | PRN
  Administered 2021-07-31: 50 ug via INTRAVENOUS
  Administered 2021-07-31: 100 ug via INTRAVENOUS

## 2021-07-31 MED ORDER — CHLORHEXIDINE GLUCONATE 0.12 % MT SOLN
15.0000 mL | Freq: Once | OROMUCOSAL | Status: AC
  Administered 2021-07-31: 15 mL via OROMUCOSAL
  Filled 2021-07-31: qty 15

## 2021-07-31 MED ORDER — SUCCINYLCHOLINE CHLORIDE 200 MG/10ML IV SOSY
PREFILLED_SYRINGE | INTRAVENOUS | Status: DC | PRN
  Administered 2021-07-31: 120 mg via INTRAVENOUS

## 2021-07-31 MED ORDER — 0.9 % SODIUM CHLORIDE (POUR BTL) OPTIME
TOPICAL | Status: DC | PRN
  Administered 2021-07-31: 1000 mL

## 2021-07-31 MED ORDER — ROCURONIUM BROMIDE 10 MG/ML (PF) SYRINGE
PREFILLED_SYRINGE | INTRAVENOUS | Status: DC | PRN
  Administered 2021-07-31: 70 mg via INTRAVENOUS
  Administered 2021-07-31: 30 mg via INTRAVENOUS

## 2021-07-31 MED ORDER — HEPARIN SODIUM (PORCINE) 1000 UNIT/ML IJ SOLN
INTRAMUSCULAR | Status: AC
  Filled 2021-07-31: qty 20

## 2021-07-31 MED ORDER — PROTAMINE SULFATE 10 MG/ML IV SOLN
INTRAVENOUS | Status: DC | PRN
  Administered 2021-07-31 (×3): 10 mg via INTRAVENOUS
  Administered 2021-07-31: 20 mg via INTRAVENOUS

## 2021-07-31 MED ORDER — HYDRALAZINE HCL 20 MG/ML IJ SOLN
5.0000 mg | INTRAMUSCULAR | Status: DC | PRN
  Administered 2021-08-03: 5 mg via INTRAVENOUS
  Filled 2021-07-31: qty 0.25
  Filled 2021-07-31 (×2): qty 1

## 2021-07-31 MED ORDER — ASPIRIN EC 81 MG PO TBEC: 81.0000 mg | DELAYED_RELEASE_TABLET | Freq: Every day | ORAL | Status: DC

## 2021-07-31 MED ORDER — DOCUSATE SODIUM 100 MG PO CAPS: 100.0000 mg | ORAL_CAPSULE | Freq: Every day | ORAL | Status: DC

## 2021-07-31 MED ORDER — FENTANYL 2500MCG IN NS 250ML (10MCG/ML) PREMIX INFUSION
25.0000 ug/h | INTRAVENOUS | Status: DC
  Administered 2021-07-31: 25 ug/h via INTRAVENOUS
  Filled 2021-07-31: qty 250

## 2021-07-31 MED ORDER — METOPROLOL TARTRATE 5 MG/5ML IV SOLN: 2.0000 mg | INTRAVENOUS | Status: DC | PRN

## 2021-07-31 MED ORDER — PANTOPRAZOLE SODIUM 40 MG IV SOLR
40.0000 mg | INTRAVENOUS | Status: DC
Start: 2021-07-31 — End: 2021-07-31
  Administered 2021-07-31: 40 mg via INTRAVENOUS
  Filled 2021-07-31: qty 10

## 2021-07-31 MED ORDER — CEFAZOLIN IN SODIUM CHLORIDE 3-0.9 GM/100ML-% IV SOLN
3.0000 g | INTRAVENOUS | Status: AC
  Administered 2021-07-31: 3 g via INTRAVENOUS
  Filled 2021-07-31: qty 100

## 2021-07-31 MED ORDER — CLEVIDIPINE BUTYRATE 0.5 MG/ML IV EMUL
INTRAVENOUS | Status: DC | PRN
  Administered 2021-07-31: 2 mg/h via INTRAVENOUS

## 2021-07-31 MED ORDER — MORPHINE SULFATE (PF) 2 MG/ML IV SOLN
INTRAVENOUS | Status: AC
Start: 2021-07-31 — End: 2021-08-01
  Filled 2021-07-31: qty 1

## 2021-07-31 MED ORDER — ORAL CARE MOUTH RINSE: 15.0000 mL | Freq: Once | OROMUCOSAL | Status: AC

## 2021-07-31 MED ORDER — PROPOFOL 10 MG/ML IV BOLUS
INTRAVENOUS | Status: DC | PRN
Start: 2021-07-31 — End: 2021-07-31
  Administered 2021-07-31: 200 mg via INTRAVENOUS

## 2021-07-31 MED ORDER — INSULIN ASPART 100 UNIT/ML IJ SOLN
0.0000 [IU] | INTRAMUSCULAR | Status: DC
  Administered 2021-07-31: 8 [IU] via SUBCUTANEOUS
  Administered 2021-07-31: 5 [IU] via SUBCUTANEOUS
  Administered 2021-08-01: 3 [IU] via SUBCUTANEOUS
  Administered 2021-08-01: 5 [IU] via SUBCUTANEOUS
  Administered 2021-08-01: 3 [IU] via SUBCUTANEOUS
  Administered 2021-08-01: 8 [IU] via SUBCUTANEOUS
  Administered 2021-08-01: 3 [IU] via SUBCUTANEOUS
  Administered 2021-08-01: 8 [IU] via SUBCUTANEOUS
  Administered 2021-08-02: 3 [IU] via SUBCUTANEOUS
  Administered 2021-08-02: 11 [IU] via SUBCUTANEOUS
  Administered 2021-08-02: 3 [IU] via SUBCUTANEOUS
  Administered 2021-08-02: 8 [IU] via SUBCUTANEOUS
  Administered 2021-08-02: 3 [IU] via SUBCUTANEOUS
  Administered 2021-08-03 (×2): 8 [IU] via SUBCUTANEOUS
  Administered 2021-08-03: 3 [IU] via SUBCUTANEOUS
  Administered 2021-08-03: 15 [IU] via SUBCUTANEOUS

## 2021-07-31 MED ORDER — PANTOPRAZOLE SODIUM 40 MG PO TBEC: 40.0000 mg | DELAYED_RELEASE_TABLET | Freq: Every day | ORAL | Status: DC

## 2021-07-31 MED ORDER — MAGNESIUM SULFATE 2 GM/50ML IV SOLN: 2.0000 g | Freq: Every day | INTRAVENOUS | Status: DC | PRN

## 2021-07-31 MED ORDER — OXYCODONE-ACETAMINOPHEN 5-325 MG PO TABS: 1.0000 | ORAL_TABLET | ORAL | Status: DC | PRN

## 2021-07-31 SURGICAL SUPPLY — 53 items
BAG COUNTER SPONGE SURGICOUNT (BAG) ×2 IMPLANT
BAG DECANTER FOR FLEXI CONT (MISCELLANEOUS) ×2 IMPLANT
BIOPATCH RED 1 DISK 7.0 (GAUZE/BANDAGES/DRESSINGS) ×1 IMPLANT
CANISTER SUCT 3000ML PPV (MISCELLANEOUS) ×2 IMPLANT
CATH ROBINSON RED A/P 18FR (CATHETERS) ×2 IMPLANT
CLIP LIGATING EXTRA MED SLVR (CLIP) ×2 IMPLANT
CLIP LIGATING EXTRA SM BLUE (MISCELLANEOUS) ×2 IMPLANT
COVER PROBE W GEL 5X96 (DRAPES) ×2 IMPLANT
DERMABOND ADVANCED (GAUZE/BANDAGES/DRESSINGS) ×1
DERMABOND ADVANCED .7 DNX12 (GAUZE/BANDAGES/DRESSINGS) ×1 IMPLANT
DRAIN CHANNEL 10M FLAT 3/4 FLT (DRAIN) ×1 IMPLANT
DRAIN CHANNEL 15F RND FF W/TCR (WOUND CARE) IMPLANT
DRSG COVADERM 4X6 (GAUZE/BANDAGES/DRESSINGS) ×1 IMPLANT
ELECT REM PT RETURN 9FT ADLT (ELECTROSURGICAL) ×2
ELECTRODE REM PT RTRN 9FT ADLT (ELECTROSURGICAL) ×1 IMPLANT
EVACUATOR SILICONE 100CC (DRAIN) ×1 IMPLANT
GAUZE SPONGE 4X4 12PLY STRL (GAUZE/BANDAGES/DRESSINGS) ×1 IMPLANT
GLOVE BIO SURGEON STRL SZ7.5 (GLOVE) ×2 IMPLANT
GOWN STRL REUS W/ TWL LRG LVL3 (GOWN DISPOSABLE) ×2 IMPLANT
GOWN STRL REUS W/ TWL XL LVL3 (GOWN DISPOSABLE) ×1 IMPLANT
GOWN STRL REUS W/TWL LRG LVL3 (GOWN DISPOSABLE) ×4
GOWN STRL REUS W/TWL XL LVL3 (GOWN DISPOSABLE) ×2
HEMOSTAT SNOW SURGICEL 2X4 (HEMOSTASIS) IMPLANT
INSERT FOGARTY SM (MISCELLANEOUS) IMPLANT
IV ADAPTER SYR DOUBLE MALE LL (MISCELLANEOUS) IMPLANT
KIT BASIN OR (CUSTOM PROCEDURE TRAY) ×2 IMPLANT
KIT SHUNT ARGYLE CAROTID ART 6 (VASCULAR PRODUCTS) ×2 IMPLANT
KIT TURNOVER KIT B (KITS) ×2 IMPLANT
NDL HYPO 25GX1X1/2 BEV (NEEDLE) IMPLANT
NDL SPNL 20GX3.5 QUINCKE YW (NEEDLE) IMPLANT
NEEDLE HYPO 25GX1X1/2 BEV (NEEDLE) IMPLANT
NEEDLE SPNL 20GX3.5 QUINCKE YW (NEEDLE) IMPLANT
NS IRRIG 1000ML POUR BTL (IV SOLUTION) ×6 IMPLANT
PACK CAROTID (CUSTOM PROCEDURE TRAY) ×2 IMPLANT
PAD ARMBOARD 7.5X6 YLW CONV (MISCELLANEOUS) ×4 IMPLANT
POSITIONER HEAD DONUT 9IN (MISCELLANEOUS) ×2 IMPLANT
POWDER SURGICEL 3.0 GRAM (HEMOSTASIS) ×1 IMPLANT
STAPLER VISISTAT 35W (STAPLE) ×1 IMPLANT
STOPCOCK 4 WAY LG BORE MALE ST (IV SETS) IMPLANT
SUT ETHILON 3 0 PS 1 (SUTURE) ×1 IMPLANT
SUT MNCRL AB 4-0 PS2 18 (SUTURE) ×2 IMPLANT
SUT PROLENE 6 0 BV (SUTURE) ×2 IMPLANT
SUT SILK 3 0 (SUTURE)
SUT SILK 3-0 18XBRD TIE 12 (SUTURE) IMPLANT
SUT VIC AB 2-0 CT1 27 (SUTURE) ×2
SUT VIC AB 2-0 CT1 TAPERPNT 27 (SUTURE) IMPLANT
SUT VIC AB 3-0 SH 27 (SUTURE) ×2
SUT VIC AB 3-0 SH 27X BRD (SUTURE) ×1 IMPLANT
SYR CONTROL 10ML LL (SYRINGE) IMPLANT
TAPE PAPER 3X10 WHT MICROPORE (GAUZE/BANDAGES/DRESSINGS) ×1 IMPLANT
TOWEL GREEN STERILE (TOWEL DISPOSABLE) ×2 IMPLANT
TUBING ART PRESS 48 MALE/FEM (TUBING) IMPLANT
WATER STERILE IRR 1000ML POUR (IV SOLUTION) ×2 IMPLANT

## 2021-07-31 SURGICAL SUPPLY — 56 items
BAG COUNTER SPONGE SURGICOUNT (BAG) ×2 IMPLANT
BAG DECANTER FOR FLEXI CONT (MISCELLANEOUS) ×2 IMPLANT
CANISTER SUCT 3000ML PPV (MISCELLANEOUS) ×2 IMPLANT
CANNULA VESSEL 3MM 2 BLNT TIP (CANNULA) ×1 IMPLANT
CATH ROBINSON RED A/P 18FR (CATHETERS) ×2 IMPLANT
CLIP LIGATING EXTRA MED SLVR (CLIP) ×2 IMPLANT
CLIP LIGATING EXTRA SM BLUE (MISCELLANEOUS) ×2 IMPLANT
COVER PROBE W GEL 5X96 (DRAPES) ×2 IMPLANT
DERMABOND ADVANCED (GAUZE/BANDAGES/DRESSINGS) ×1
DERMABOND ADVANCED .7 DNX12 (GAUZE/BANDAGES/DRESSINGS) ×1 IMPLANT
DRAIN CHANNEL 15F RND FF W/TCR (WOUND CARE) IMPLANT
ELECT REM PT RETURN 9FT ADLT (ELECTROSURGICAL) ×2
ELECTRODE REM PT RTRN 9FT ADLT (ELECTROSURGICAL) ×1 IMPLANT
EVACUATOR SILICONE 100CC (DRAIN) IMPLANT
GLOVE BIO SURGEON STRL SZ7.5 (GLOVE) ×2 IMPLANT
GLOVE BIOGEL PI IND STRL 6 (GLOVE) IMPLANT
GLOVE BIOGEL PI IND STRL 6.5 (GLOVE) IMPLANT
GLOVE BIOGEL PI INDICATOR 6 (GLOVE) ×1
GLOVE BIOGEL PI INDICATOR 6.5 (GLOVE) ×1
GLOVE SURG SS PI 6.5 STRL IVOR (GLOVE) ×1 IMPLANT
GOWN STRL REUS W/ TWL LRG LVL3 (GOWN DISPOSABLE) ×2 IMPLANT
GOWN STRL REUS W/ TWL XL LVL3 (GOWN DISPOSABLE) ×1 IMPLANT
GOWN STRL REUS W/TWL LRG LVL3 (GOWN DISPOSABLE) ×4
GOWN STRL REUS W/TWL XL LVL3 (GOWN DISPOSABLE) ×2
HEMOSTAT SNOW SURGICEL 2X4 (HEMOSTASIS) IMPLANT
INSERT FOGARTY SM (MISCELLANEOUS) IMPLANT
IV ADAPTER SYR DOUBLE MALE LL (MISCELLANEOUS) IMPLANT
KIT BASIN OR (CUSTOM PROCEDURE TRAY) ×2 IMPLANT
KIT SHUNT ARGYLE CAROTID ART 6 (VASCULAR PRODUCTS) ×2 IMPLANT
KIT TURNOVER KIT B (KITS) ×2 IMPLANT
MAT PREVALON FULL STRYKER (MISCELLANEOUS) ×1 IMPLANT
NDL HYPO 25GX1X1/2 BEV (NEEDLE) IMPLANT
NDL SPNL 20GX3.5 QUINCKE YW (NEEDLE) IMPLANT
NEEDLE HYPO 25GX1X1/2 BEV (NEEDLE) IMPLANT
NEEDLE SPNL 20GX3.5 QUINCKE YW (NEEDLE) IMPLANT
NS IRRIG 1000ML POUR BTL (IV SOLUTION) ×6 IMPLANT
PACK CAROTID (CUSTOM PROCEDURE TRAY) ×2 IMPLANT
PAD ARMBOARD 7.5X6 YLW CONV (MISCELLANEOUS) ×4 IMPLANT
PATCH VASC XENOSURE 1CMX6CM (Vascular Products) ×2 IMPLANT
PATCH VASC XENOSURE 1X6 (Vascular Products) IMPLANT
POSITIONER HEAD DONUT 9IN (MISCELLANEOUS) ×2 IMPLANT
POWDER SURGICEL 3.0 GRAM (HEMOSTASIS) ×1 IMPLANT
SPONGE T-LAP 18X18 ~~LOC~~+RFID (SPONGE) ×2 IMPLANT
STOPCOCK 4 WAY LG BORE MALE ST (IV SETS) IMPLANT
SUT ETHILON 3 0 PS 1 (SUTURE) IMPLANT
SUT MNCRL AB 4-0 PS2 18 (SUTURE) ×2 IMPLANT
SUT PROLENE 6 0 BV (SUTURE) ×3 IMPLANT
SUT SILK 3 0 (SUTURE)
SUT SILK 3-0 18XBRD TIE 12 (SUTURE) IMPLANT
SUT VIC AB 3-0 SH 27 (SUTURE) ×2
SUT VIC AB 3-0 SH 27X BRD (SUTURE) ×1 IMPLANT
SYR 20ML LL LF (SYRINGE) ×1 IMPLANT
SYR CONTROL 10ML LL (SYRINGE) IMPLANT
TOWEL GREEN STERILE (TOWEL DISPOSABLE) ×2 IMPLANT
TUBING ART PRESS 48 MALE/FEM (TUBING) IMPLANT
WATER STERILE IRR 1000ML POUR (IV SOLUTION) ×2 IMPLANT

## 2021-07-31 NOTE — Transfer of Care (Signed)
Immediate Anesthesia Transfer of Care Note ? ?Patient: Mario Green ? ?Procedure(s) Performed: RIGHT ENDARTERECTOMY CAROTID WITH PATCH ANGIOPLASTY (Right: Neck) ? ?Patient Location: PACU ? ?Anesthesia Type:Regional ? ?Level of Consciousness: awake, alert  and oriented ? ?Airway & Oxygen Therapy: Patient Spontanous Breathing ? ?Post-op Assessment: Report given to RN, Post -op Vital signs reviewed and stable, Patient moving all extremities X 4 and Patient able to stick tongue midline ? ?Post vital signs: Reviewed and stable ? ?Last Vitals:  ?Vitals Value Taken Time  ?BP 151/74 07/31/21 1027  ?Temp    ?Pulse 70 07/31/21 1032  ?Resp 11 07/31/21 1032  ?SpO2 94 % 07/31/21 1032  ?Vitals shown include unvalidated device data. ? ?Last Pain:  ?Vitals:  ? 07/31/21 0630  ?TempSrc:   ?PainSc: 0-No pain  ?   ? ?  ? ?Complications: No notable events documented. ?

## 2021-07-31 NOTE — Anesthesia Procedure Notes (Signed)
Procedure Name: Intubation ?Date/Time: 07/31/2021 7:50 AM ?Performed by: Harden Mo, CRNA ?Pre-anesthesia Checklist: Patient identified, Emergency Drugs available, Suction available and Patient being monitored ?Patient Re-evaluated:Patient Re-evaluated prior to induction ?Oxygen Delivery Method: Circle System Utilized ?Preoxygenation: Pre-oxygenation with 100% oxygen ?Induction Type: IV induction ?Ventilation: Mask ventilation with difficulty, Two handed mask ventilation required and Oral airway inserted - appropriate to patient size ?Laryngoscope Size: Glidescope and 4 ?Grade View: Grade I ?Tube type: Oral ?Tube size: 7.5 mm ?Number of attempts: 1 ?Airway Equipment and Method: Stylet and Oral airway ?Placement Confirmation: ETT inserted through vocal cords under direct vision, positive ETCO2 and breath sounds checked- equal and bilateral ?Secured at: 24 cm ?Tube secured with: Tape ?Dental Injury: Teeth and Oropharynx as per pre-operative assessment  ? ? ? ? ?

## 2021-07-31 NOTE — Anesthesia Procedure Notes (Signed)
Procedure Name: Intubation ?Date/Time: 07/31/2021 2:01 PM ?Performed by: Betha Loa, CRNA ?Pre-anesthesia Checklist: Patient identified, Emergency Drugs available, Suction available and Patient being monitored ?Patient Re-evaluated:Patient Re-evaluated prior to induction ?Oxygen Delivery Method: Circle System Utilized ?Preoxygenation: Pre-oxygenation with 100% oxygen ?Induction Type: IV induction, Rapid sequence and Cricoid Pressure applied ?Laryngoscope Size: Glidescope and 4 ?Grade View: Grade II ?Tube type: Oral ?Tube size: 7.5 mm ?Number of attempts: 1 ?Airway Equipment and Method: Rigid stylet and Video-laryngoscopy ?Placement Confirmation: ETT inserted through vocal cords under direct vision, positive ETCO2 and breath sounds checked- equal and bilateral ?Secured at: 24 cm ?Tube secured with: Tape ?Dental Injury: Teeth and Oropharynx as per pre-operative assessment  ?Difficulty Due To: Difficulty was anticipated ? ? ? ? ?

## 2021-07-31 NOTE — Progress Notes (Signed)
eLink Physician-Brief Progress Note ?Patient Name: Mario Green ?DOB: 05-Nov-1955 ?MRN: [id number] ? ? ?Date of Service ? 07/31/2021  ?HPI/Events of Note ? Patient is s/p carotid endarterectomy, he developed sinus bradycardia, dropped his heart rate into the 40's but maintained a normal BP through out, he also has 1st degree AV block on 12 lead EKG. Propofol and fentanyl are being weaned, bedside RN denies giving him a beta blocker (it's on his MAR).  ?eICU Interventions ? Wean Propofol and Fentanyl as tolerated, Atropine and external pacer are at bedside in case needed, will monitor patient as long as BP remains acceptable, additional option for symptomatic bradycardia will include Dopamine gtt. Echocardiogram to assess LV function, Beta blockers discontinued .  ? ? ? ?  ? ?[name] ?07/31/2021, 8:04 PM ?

## 2021-07-31 NOTE — Plan of Care (Signed)
?  Problem: Education: ?Goal: Knowledge of General Education information will improve ?Description: Including pain rating scale, medication(s)/side effects and non-pharmacologic comfort measures ?Outcome: Progressing ?  ?Problem: Health Behavior/Discharge Planning: ?Goal: Ability to manage health-related needs will improve ?Outcome: Progressing ?  ?Problem: Clinical Measurements: ?Goal: Ability to maintain clinical measurements within normal limits will improve ?Outcome: Progressing ?Goal: Will remain free from infection ?Outcome: Progressing ?Goal: Diagnostic test results will improve ?Outcome: Progressing ?Goal: Respiratory complications will improve ?Outcome: Progressing ?Goal: Cardiovascular complication will be avoided ?Outcome: Progressing ?  ?Problem: Activity: ?Goal: Risk for activity intolerance will decrease ?Outcome: Progressing ?  ?Problem: Nutrition: ?Goal: Adequate nutrition will be maintained ?Outcome: Progressing ?  ?Problem: Coping: ?Goal: Level of anxiety will decrease ?Outcome: Progressing ?  ?Problem: Elimination: ?Goal: Will not experience complications related to bowel motility ?Outcome: Progressing ?Goal: Will not experience complications related to urinary retention ?Outcome: Progressing ?  ?Problem: Pain Managment: ?Goal: General experience of comfort will improve ?Outcome: Progressing ?  ?Problem: Safety: ?Goal: Ability to remain free from injury will improve ?Outcome: Progressing ?  ?Problem: Skin Integrity: ?Goal: Risk for impaired skin integrity will decrease ?Outcome: Progressing ?  ?Problem: Education: ?Goal: Understanding of CV disease, CV risk reduction, and recovery process will improve ?Outcome: Progressing ?Goal: Individualized Educational Video(s) ?Outcome: Progressing ?  ?Problem: Activity: ?Goal: Ability to return to baseline activity level will improve ?Outcome: Progressing ?  ?Problem: Cardiovascular: ?Goal: Ability to achieve and maintain adequate cardiovascular perfusion  will improve ?Outcome: Progressing ?Goal: Vascular access site(s) Level 0-1 will be maintained ?Outcome: Progressing ?  ?Problem: Health Behavior/Discharge Planning: ?Goal: Ability to safely manage health-related needs after discharge will improve ?Outcome: Progressing ?  ?Problem: Activity: ?Goal: Ability to tolerate increased activity will improve ?Outcome: Progressing ?  ?Problem: Respiratory: ?Goal: Ability to maintain a clear airway and adequate ventilation will improve ?Outcome: Progressing ?  ?Problem: Role Relationship: ?Goal: Method of communication will improve ?Outcome: Progressing ?  ?

## 2021-07-31 NOTE — Interval H&P Note (Signed)
History and Physical Interval Note: ? ?07/31/2021 ?7:17 AM ? ?Mario Green  has presented today for surgery, with the diagnosis of Right Carotid Artery Stenosis.  The various methods of treatment have been discussed with the patient and family. After consideration of risks, benefits and other options for treatment, the patient has consented to  Procedure(s): ?RIGHT ENDARTERECTOMY CAROTID (Right) as a surgical intervention.  The patient's history has been reviewed, patient examined, no change in status, stable for surgery.  I have reviewed the patient's chart and labs.  Questions were answered to the patient's satisfaction.   ? ? ?[name] ? ? ?

## 2021-07-31 NOTE — Anesthesia Procedure Notes (Signed)
Arterial Line Insertion ?Start/End4/25/2023 2:00 PM, 07/31/2021 2:05 PM ?Performed by: Betha Loa, CRNA, CRNA ? Patient location: Pre-op. ?Preanesthetic checklist: patient identified, IV checked, site marked, risks and benefits discussed, surgical consent, monitors and equipment checked, pre-op evaluation, timeout performed and anesthesia consent ?Emergency situation ?Lidocaine 1% used for infiltration ?Left, radial was placed ?Catheter size: 20 G ?Hand hygiene performed  and maximum sterile barriers used  ? ?Attempts: 1 ?Procedure performed using ultrasound guided technique. ?Following insertion, dressing applied and Biopatch. ?Post procedure assessment: normal and unchanged ? ?Patient tolerated the procedure well with no immediate complications. ? ? ?

## 2021-07-31 NOTE — Progress Notes (Signed)
eLink Physician-Brief Progress Note ?Patient Name: Mario Green ?DOB: 1955/12/23 ?MRN: [id number] ? ? ?Date of Service ? 07/31/2021  ?HPI/Events of Note ? Bladder scan + for 530 ml of urine,  Temp 93.7.  ?eICU Interventions ? Foley protocol ordered, Bair Hugger ordered, Bedside RN to notify E-Link if heart rate < 40 or MAP < 65 mmHg.  ? ? ? ?  ? ?Kerry Kass Kearstyn Avitia ?07/31/2021, 9:54 PM ?

## 2021-07-31 NOTE — Anesthesia Preprocedure Evaluation (Addendum)
Anesthesia Evaluation  ?Patient identified by MRN, date of birth, ID band ?Patient awake ? ? ? ?Reviewed: ?Allergy & Precautions, NPO status , Patient's Chart, lab work & pertinent test results ? ?Airway ?Mallampati: IV ? ?TM Distance: >3 FB ?Neck ROM: Full ? ? ?Comment: Neck hematoma Dental ? ?(+) Dental Advisory Given ?  ?Pulmonary ?sleep apnea ,  ?  ?breath sounds clear to auscultation ? ? ? ? ? ? Cardiovascular ?hypertension, Pt. on medications and Pt. on home beta blockers ?+ CAD and + CABG  ?+ dysrhythmias  ?Rhythm:Regular Rate:Normal ? ? ?  ?Neuro/Psych ?negative neurological ROS ?   ? GI/Hepatic ?  ?Endo/Other  ?diabetes ? 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 ?  ?Musculoskeletal ? ? Abdominal ?  ?Peds ? Hematology ? ?(+) Blood dyscrasia, anemia ,   ?Anesthesia Other Findings ? ? Reproductive/Obstetrics ? ?  ? ? ? ? ? ? ? ? ? ? ? ? ? ?  ?  ? ? ? ? ? ? ? ?Lab Results  ?Component Value Date  ? WBC 7.4 07/24/2021  ? HGB 13.6 07/24/2021  ? HCT 41.8 07/24/2021  ? MCV 92.9 07/24/2021  ? PLT 174 07/24/2021  ? ?Lab Results  ?Component Value Date  ? CREATININE 2.55 (H) 07/24/2021  ? BUN 51 (H) 07/24/2021  ? NA 139 07/24/2021  ? K 4.3 07/24/2021  ? CL 106 07/24/2021  ? CO2 24 07/24/2021  ? ? ?Anesthesia Physical ? ?Anesthesia Plan ? ?ASA: 3 and emergent ? ?Anesthesia Plan: General  ? ?Post-op Pain Management: Tylenol PO (pre-op)* and Minimal or no pain anticipated  ? ?Induction: Intravenous and Rapid sequence ? ?PONV Risk Score and Plan: 2 and Dexamethasone, Ondansetron and Treatment may vary due to age or medical condition ? ?Airway Management Planned: Oral ETT and Video Laryngoscope Planned ? ?Additional Equipment: Arterial line ? ?Intra-op Plan:  ? ?Post-operative Plan: Possible Post-op intubation/ventilation ? ?Informed Consent: I have reviewed the patients History and Physical, chart, labs and discussed the procedure including the risks, benefits and alternatives for the proposed  anesthesia with the patient or authorized representative who has indicated his/her understanding and acceptance.  ? ? ? ?Dental advisory given ? ?Plan Discussed with:  ? ?Anesthesia Plan Comments: ( ?)  ? ? ? ? ? ? ?Anesthesia Quick Evaluation ? ?

## 2021-07-31 NOTE — Progress Notes (Signed)
?  ?  I was called earlier for right neck hematoma and on evaluation the neck was soft and he was exchanging air well without any swallowing difficulty.  I was just called back to the bedside for worsened hematoma extending up near his ear where now he has significant skin discoloration.  He is still having good air exchange and swallowing without difficulty but he is having difficulty turning his head to the right.  Given that this hematoma appears to be slowly expanding with finding of generalized ooze earlier today with patient on aspirin Plavix I discussed with the patient taking him back to the operating room for washout and drain placement.  He demonstrates good understanding of this although has had general anesthesia today and will need to proceed emergently without consent.  I have called his wife to update she has not answered I will attempt to call again before operative intervention.  All the patient's questions were answered. ? ?Carmina Walle C. Donzetta Matters, MD ?

## 2021-07-31 NOTE — Progress Notes (Addendum)
I noticed patient's right neck hematoma had enlarged since last assessment. Margins marked. Hematoma was soft and patient stated he had no difficulty swallowing. Lung sounds clear upon auscultation. Vital signs stable. I called Donzetta Matters, MD who evaluated the patient in the PACU and made the decision to bring the patient back to the OR to drain the hematoma. Anesthesia at the bedside to take patient to the OR. ? ? ?

## 2021-07-31 NOTE — Op Note (Signed)
? ? ?Patient name: Mario Green MRN: [id number] DOB: 03-05-1956 Sex: male ? ?07/31/2021 ?Pre-operative Diagnosis: asymptomatic right ica stenosis ?Post-operative diagnosis:  Same ?Surgeon:  Eda Paschal. Donzetta Matters, MD ?Assistants: Melene Muller, MD; Arlee Muslim, PA ?Procedure Performed: Right carotid endarterectomy with bovine pericardial patch angioplasty ? ?Indications: 66 year old male found to have asymptomatic right ICA stenosis.  We attempted to get CT angio but given elevated creatinine this was not performed.  We have now discussed proceeding with right carotid endarterectomy he demonstrates good understanding and presents for procedure today. ? ?Findings: The ICA was medial to the ECA.  3 branches of the ECA were ligated for better exposure.  The ansa cervicalis was also ligated the hypoglossal nerve was identified and protected.  At completion of endarterectomy and patch angioplasty there was very strong signal distal to the patch that appeared to be low resistance with somewhat high resistance in the external carotid artery signal.  Patient was neurologically intact upon awakening from anesthesia. ?  ?Procedure:  The patient was identified in the holding area and taken to the operating where is placed supine operative table and general anesthesia was induced.  He was sterilely prepped and draped in the neck and chest in the usual fashion, antibiotics were minister timeout was called.  We began with longitudinal incision along the anterior border the sternocleidomastoid.  We dissected down through the platysma down to the level of the sternocleidomastoid.  Actually were somewhat low initially at the level of the omohyoid and we dissected further down to the common carotid artery and placed an umbilical tape around this and the patient was fully heparinized.  We then dissected further up on the common carotid artery then 5 the facial vein divided this in multiple areas between ties.  The external carotid artery was  identified with the superior thyroidal branch coming off of this and this was a lateral lying vessel.  Superior thyroidal was divided.  We initially placed an vessel loop around the external carotid artery as we did began dissecting the ICA this was very medial.  Ultimately 2 additional branches of the ECA were divided between ties.  The hypoglossal nerve was identified and this was mobilized by dividing the ansa cervicalis between clips.  We stayed right on the ICA and it appeared that I did identify glossopharyngeal nerve just deep to the digastric.  This was protected.  Ultimately we were able to get high enough by retracting the digastric to identify a normal-appearing level of the ICA and umbilical tape was placed around this.  ACT returned initially low additional 5000 of heparin was given and ACT was almost 300.  We repaired a 10 [place] shunt.  We then clamped the ICA followed by the common carotid artery and then the ECA and opened the vessel longitudinally up until where it appeared normal.  There was very strong backbleeding.  Given the difficulty with how high the lesion was I elected not to place a shunt.  I then performed endarterectomy with smooth tapering distally and good eversion of the external carotid artery with strong backbleeding.  We thoroughly irrigated throughout her endarterectomy with heparinized saline and dextran.  We then sewed a bovine pericardial patch in place with 6-0 Prolene suture.  Prior to completion we allowed antegrade and backbleeding and then thoroughly flushed with 2 syringes of heparin and 1 syringe of dextran.  We completed the endarterectomy patch angioplasty and then released the clamp on the external carotid artery followed by the common  carotid artery and after several cardiac cycles the internal carotid artery.  The signal in the internal carotid artery was expected with low resistance.  We administered 50 mg of protamine.  We meticulously obtained hemostasis and  irrigated the wound.  We closed the platysma with Vicryl and the skin with Monocryl.  Patient was awakened from anesthesia and noted to be neurologically intact and transferred to the recovery area in stable condition.  All counts were correct at completion. ? ? ?Given the complexity of the case,  the assistant was necessary in order to expedient the procedure and safely perform the technical aspects of the operation.  The assistant provided traction and countertraction to assist with exposure of the common carotid artery, external carotid artery, and internal carotid artery.  They also assisted with suture ligatures and dividing the facial vein and multiple small venous branches tethering the hypoglossal nerve. The assistant also played a critical role in placing the shunt safely.  In addition they were necessary to provide adequate traction and countertraction to perform a precise endarterectomy and precise closure.  These skills, especially following the Prolene suture for the anastomosis, could not have been adequately performed by a scrub tech assistant.  ? ? ? ?EBL: 100cc ? ?Zethan Alfieri C. Donzetta Matters, MD ?[facility] ?Office: [phone] ?Pager: [phone] ? ? ?

## 2021-07-31 NOTE — Anesthesia Procedure Notes (Signed)
Arterial Line Insertion ?Start/End4/25/2023 7:05 AM, 07/31/2021 7:25 AM ?Performed by: Betha Loa, CRNA, CRNA ? Patient location: Pre-op. ?Preanesthetic checklist: patient identified, IV checked, site marked, risks and benefits discussed, surgical consent, monitors and equipment checked, pre-op evaluation and anesthesia consent ?Lidocaine 1% used for infiltration ?Left, radial was placed ?Catheter size: 20 G ?Hand hygiene performed  and maximum sterile barriers used  ? ?Attempts: 2 ?Procedure performed without using ultrasound guided technique. ?Ultrasound Notes:anatomy identified, needle tip was noted to be adjacent to the nerve/plexus identified and no ultrasound evidence of intravascular and/or intraneural injection ?Following insertion, dressing applied and Biopatch. ?Post procedure assessment: normal and unchanged ? ?Patient tolerated the procedure well with no immediate complications. ? ? ?

## 2021-07-31 NOTE — Op Note (Signed)
? ? ?  Patient name: DENIEL MCQUISTON MRN: [id number] DOB: 04-15-1955 Sex: male ? ?07/31/2021 ?Pre-operative Diagnosis: Neck hematoma status post right carotid endarterectomy ?Post-operative diagnosis:  Same ?Surgeon:  Eda Paschal. Donzetta Matters, MD ?Assistant: Arlee Muslim, PA ?Procedure Performed: Exploration right neck incision with evacuation of hematoma and placement of 10 flat JP drain ? ?Indications: 66 year old male underwent right carotid endarterectomy earlier today.  He did have oozing of from the neck incision he had but prior to closure.  That this had all been controlled.  In the PACU he accumulated initially a small hematoma and later a large hematoma which was preventing him from turning his neck fully.  He did not have any issues with air exchange or swallowing but I recommended return to the operating room.  His questions were answered and I updated the family prior to return. ? ?Findings: Per anesthesia on intubation there was pharyngeal compression.  We evacuated significant hematoma from the neck incision and then used Surgicel powder as well as cautery to control small bleeding.  There did not appear to be any bleeding from the patch.  We irrigated the wound and placed a 10 French flat JP drain and closed with interrupted Vicryl and staples. ?  ?Procedure:  The patient was identified in the holding area and taken to the operating room where is placed supine on upper table and general anesthesia was induced.  Sterilely prepped and draped in the right neck and chest in usual fashion, antibiotics were updated timeout was called.  We opened his previous neck incision where we encountered significant finding, and this was all removed.  We then evaluated all of the incision as well as the past we did not identify any significant bleeding only diffuse tissues.  We controlled this with cautery as well as Surgicel powder evaluation and pressure.  We tunneled a 10 French flat JP drain through a counterincision and this  was sutured to the skin with 3-0 nylon suture.  The wound was further evaluated and irrigated and closed with interrupted 2-0 Vicryl for small level and staples of the skin and a sterile dressing was applied.  Given the pharyngeal swelling the plan was to transfer the patient to the ICU for possible extubation tonight versus tomorrow.  He was otherwise hemodynamically stable.  All counts were correct at completion. ? ?EBL: 50 cc ? ?Faria Casella C. Donzetta Matters, MD ?[facility] ?Office: [phone] ?Pager: [phone] ? ? ?

## 2021-07-31 NOTE — Transfer of Care (Addendum)
Immediate Anesthesia Transfer of Care Note ? ?Patient: Mario Green ? ?Procedure(s) Performed: Hematoma Evacuation Right Post Carotid Endarterectomy (Right: Neck) ? ?Patient Location: SICU ? ?Anesthesia Type:General ? ?Level of Consciousness: Patient remains intubated per anesthesia plan ? ?Airway & Oxygen Therapy: Patient remains intubated per anesthesia plan and Patient placed on Ventilator (see vital sign flow sheet for setting) ? ?Post-op Assessment: Report given to RN and Post -op Vital signs reviewed and stable ? ?Post vital signs: Reviewed and stable ? ?Last Vitals:  ?Vitals Value Taken Time  ?BP 115/54   ?Temp    ?Pulse 66 07/31/21 1553  ?Resp 17 07/31/21 1553  ?SpO2 95 % 07/31/21 1553  ?Vitals shown include unvalidated device data. ? ?Last Pain:  ?Vitals:  ? 07/31/21 1312  ?TempSrc:   ?PainSc: Asleep  ?   ? ?Patients Stated Pain Goal: 5 (07/31/21 1242) ? ?Complications: No notable events documented. ?

## 2021-07-31 NOTE — Consult Note (Signed)
? ?NAME:  Mario Green, MRN:  [id number], DOB:  20-Dec-1955, LOS: 0 ?ADMISSION DATE:  07/31/2021, CONSULTATION DATE:  4/25 ?REFERRING MD:  Donzetta Matters, REASON FOR CONSULT:  Post operative vent and medical management ? ?History of Present Illness:  ?Patient is intubated. Therefore history has been obtained from chart review.  ? ?Mario Green, is a 66 y.o. male, who presented to [facility] on 4/25 for a planned right carotid endarterectomy with Dr. Donzetta Matters. ? ?The procedure was completed without issue. Post-operatively he developed a hematoma of the right neck for which he was taken back to the OR for a take down. A hematoma was evacuated and JP drain was placed. He remained on the ventilator post-op due to an absence of a cuff leak. ? ?PCCM was consulted for assistance with ventilator and medical management ? ? ?Pertinent  Medical History  ?Carotid stenosis, CAD, HTN, HX CABG,  HLD, OSA, DM2, obesity  ? ?Significant Hospital Events: ?Including procedures, antibiotic start and stop dates in addition to other pertinent events   ?4/25 Presented to [facility] for right carotid endarterectomy . Taken back to OR for hematoma, PCCM consult ? ?Interim History / Subjective:  ?See above ? ?Unable to obtain subjective evaluation due to patient status ? ? ?Objective   ?Blood pressure (!) 153/64, pulse 68, temperature 98.1 ?F (36.7 ?C), resp. rate 10, height 6' (1.829 m), weight 136.1 kg, SpO2 97 %. ?   ?   ? ?Intake/Output Summary (Last 24 hours) at 07/31/2021 1515 ?Last data filed at 07/31/2021 1429 ?Gross per 24 hour  ?Intake 1300 ml  ?Output 900 ml  ?Net 400 ml  ? ?Filed Weights  ? 07/31/21 0609  ?Weight: 136.1 kg  ? ? ?Examination: ?General: In bed, NAD, appears comfortable ?HEENT: MM pink/moist, anicteric, neck swollen, JP drain to right with dark blood ?Neuro: RASS -4, PERRL 8mm, sedated ?CV: S1S2, NSR, no m/r/g appreciated ?PULM:  clear in the upper lobes, clear in the lower lobes, trachea midline, chest expansion symmetric ?GI: soft, bsx4  hypoactive, non-tender   ?Extremities: warm/dry, no pretibial edema, capillary refill less than 3 seconds  ?Skin:  expected post-operative sites no rashes or lesions noted ? ? ?Resolved Hospital Problem list   ? ? ?Assessment & Plan:  ?Acute respiratory failure, post-operative, secondary to right neck hematoma ?HX OSA ?-Hope to extubate AM of 4/26 ?-LTVV strategy with tidal volumes of 4-8 cc/kg ideal body weight ?-Goal plateau pressures less than 30 and driving pressures less than 15 ?-Wean PEEP/FiO2 for SpO2 92-98% ?-VAP bundle ?-Daily SAT and SBT. Hope to extubate AM of 4/26. ?-PAD bundle with Propofol gtt and fentanyl gtt ?-RASS goal -1 to -2 ?-CXR and ABG post-op ?-Unclear if patient uses home CPAP ? ?POD 0 right carotid endarterectomy ?Right neck hematoma s/p OR for hematoma evacuation ?-management per VVS ?-ASA/Plavix per VVS ?-follow up CBC. Goal HGB greater than 7. ?-Cleviprex GTT for GOAL:  SBP <140,  MAP 75,  HR > 60 < 120 ?-Monitor JP drain output ?-Holding on enteral tube placement ? ?HX CAD, HX CABG ?HX HTN ?HX HLD ?-ASA/Plavix per VVS ?-On cleviprex GTT. Resume home amlodipine and metoprolol when enteral route established. PRN IV hydralazine  ? ?DM2 ?On 36 u daily of levemir ?-Blood Glucose goal 140-180. ?-SSI ?-Not currently on TF. No enteral access. Holding long acting at this time. ?-DM coordinator consult ? ?CKD3 ?-Follow up BMP ?-Goal SBP <140,  MAP 75,  HR > 60 < 120 ?-IVF for hydration ? ? ?  Best Practice (right click and "Reselect all SmartList Selections" daily)  ? ?Diet/type: NPO ?DVT prophylaxis: SCD ?GI prophylaxis: PPI ?Lines: N/A ?Foley:  N/A ?Code Status:  full code ?Last date of multidisciplinary goals of care discussion [pending] ? ?Labs   ?CBC: ?No results for input(s): WBC, NEUTROABS, HGB, HCT, MCV, PLT in the last 168 hours. ? ?Basic Metabolic Panel: ?No results for input(s): NA, K, CL, CO2, GLUCOSE, BUN, CREATININE, CALCIUM, MG, PHOS in the last 168 hours. ?GFR: ?Estimated  Creatinine Clearance: 40.2 mL/min (A) (by C-G formula based on SCr of 2.55 mg/dL (H)). ?No results for input(s): PROCALCITON, WBC, LATICACIDVEN in the last 168 hours. ? ?Liver Function Tests: ?No results for input(s): AST, ALT, ALKPHOS, BILITOT, PROT, ALBUMIN in the last 168 hours. ?No results for input(s): LIPASE, AMYLASE in the last 168 hours. ?No results for input(s): AMMONIA in the last 168 hours. ? ?ABG ?No results found for: PHART, PCO2ART, PO2ART, HCO3, TCO2, ACIDBASEDEF, O2SAT  ? ?Coagulation Profile: ?No results for input(s): INR, PROTIME in the last 168 hours. ? ?Cardiac Enzymes: ?No results for input(s): CKTOTAL, CKMB, CKMBINDEX, TROPONINI in the last 168 hours. ? ?HbA1C: ?Hgb A1c MFr Bld  ?Date/Time Value Ref Range Status  ?07/24/2021 12:00 PM 7.0 (H) 4.8 - 5.6 % Final  ?  Comment:  ?  (NOTE) ?Pre diabetes:          5.7%-6.4% ? ?Diabetes:              >6.4% ? ?Glycemic control for   <7.0% ?adults with diabetes ?  ? ? ?CBG: ?Recent Labs  ?Lab 07/31/21 ?0611 07/31/21 ?1027 07/31/21 ?1310  ?GLUCAP 190* 158* 196*  ? ? ?Review of Systems:   ?Unable to complete ROS due to patient status ? ?Past Medical History:  ?He,  has a past medical history of Allergic rhinitis, Anemia, Benign essential hypertension, CAD (coronary artery disease), CKD (chronic kidney disease), symptom management only, stage 3 (moderate) ([place]), Dyslipidemia (03/24/2015), ED (erectile dysfunction), Hyperlipidemia, Morbid obesity due to excess calories ([place]) (03/24/2015), Obstructive sleep apnea (03/24/2015), Renal insufficiency, Second degree atrioventricular block, Mobitz (type) I (07/17/2021), Status post coronary artery bypass graft (03/24/2015), Tinnitus, bilateral, and Type 2 diabetes mellitus without complication ([place]) (10/06/1599).  ? ?Surgical History:  ? ?Past Surgical History:  ?Procedure Laterality Date  ? ADENOIDECTOMY    ? CARDIAC CATHETERIZATION    ? CARDIAC SURGERY    ? Tripple bypass  ? COLONOSCOPY    ?  ? ?Social History:  ?  reports that he has never smoked. He has never been exposed to tobacco smoke. He has quit using smokeless tobacco.  His smokeless tobacco use included chew. He reports that he does not currently use alcohol. He reports that he does not use drugs.  ? ?Family History:  ?His family history includes Dementia in his mother; Diabetes in his father; Heart attack in his father.  ? ?Allergies ?No Known Allergies  ? ?Home Medications  ?Prior to Admission medications   ?Medication Sig Start Date End Date Taking? Authorizing Provider  ?amLODipine (NORVASC) 2.5 MG tablet Take 2.5 mg by mouth at bedtime. 05/09/21  Yes [provider]  ?aspirin 81 MG EC tablet Take 81 mg by mouth at bedtime.   Yes [provider]  ?augmented betamethasone dipropionate (DIPROLENE-AF) 0.05 % cream Apply 1 application. topically daily as needed (Scratch). 04/12/21  Yes [provider]  ?clopidogrel (PLAVIX) 75 MG tablet Take 75 mg by mouth daily. 06/26/16  Yes [provider]  ?  Dulaglutide (TRULICITY) 4.5 GA/8.9KS SOPN Inject 4.5 mg into the skin every Monday.   Yes [provider]  ?Evolocumab 140 MG/ML SOAJ Inject 140 mg into the skin every 14 (fourteen) days. Inject 1 pen every 14 days subcutaneously ?Patient taking differently: Inject 140 mg into the skin every 14 (fourteen) days. Repatha 03/20/21  Yes Richardo Priest, MD  ?insulin aspart (NOVOLOG FLEXPEN) 100 UNIT/ML FlexPen Max daily 50 units ?Patient taking differently: 10-12 Units 3 (three) times daily before meals. Sliding scale  ?Depending on blood glucose 06/22/21  Yes Shamleffer, Melanie Crazier, MD  ?insulin detemir (LEVEMIR) 100 UNIT/ML FlexPen Inject 36 Units into the skin daily.   Yes [provider]  ?metoprolol tartrate (LOPRESSOR) 25 MG tablet Take 1 tablet (25 mg total) by mouth daily. 07/17/21  Yes Richardo Priest, MD  ?Omega-3 Fatty Acids (FISH OIL) 1000 MG CAPS Take 1,000 mg by mouth 2 (two) times daily.   Yes [provider]  ?tamsulosin (FLOMAX) 0.4 MG CAPS capsule Take 0.4 mg by mouth at bedtime. 05/07/21  Yes [provider]  ?Insulin Pen Needle 29G X 5MM MISC 1 Device by Does not apply route in the morning, at noon, i

## 2021-08-01 ENCOUNTER — Other Ambulatory Visit (HOSPITAL_COMMUNITY): Payer: Self-pay

## 2021-08-01 ENCOUNTER — Encounter (HOSPITAL_COMMUNITY): Payer: Self-pay | Admitting: Vascular Surgery

## 2021-08-01 ENCOUNTER — Inpatient Hospital Stay (HOSPITAL_COMMUNITY): Payer: Medicare HMO

## 2021-08-01 DIAGNOSIS — J9601 Acute respiratory failure with hypoxia: Secondary | ICD-10-CM | POA: Diagnosis not present

## 2021-08-01 DIAGNOSIS — I428 Other cardiomyopathies: Secondary | ICD-10-CM

## 2021-08-01 DIAGNOSIS — S1093XA Contusion of unspecified part of neck, initial encounter: Secondary | ICD-10-CM | POA: Diagnosis not present

## 2021-08-01 DIAGNOSIS — I6521 Occlusion and stenosis of right carotid artery: Secondary | ICD-10-CM | POA: Diagnosis not present

## 2021-08-01 LAB — BASIC METABOLIC PANEL
Anion gap: 10 (ref 5–15)
BUN: 45 mg/dL — ABNORMAL HIGH (ref 8–23)
CO2: 19 mmol/L — ABNORMAL LOW (ref 22–32)
Calcium: 8 mg/dL — ABNORMAL LOW (ref 8.9–10.3)
Chloride: 109 mmol/L (ref 98–111)
Creatinine, Ser: 2.24 mg/dL — ABNORMAL HIGH (ref 0.61–1.24)
GFR, Estimated: 31 mL/min — ABNORMAL LOW (ref >60)
Glucose, Bld: 193 mg/dL — ABNORMAL HIGH (ref 70–99)
Potassium: 4 mmol/L (ref 3.5–5.1)
Sodium: 138 mmol/L (ref 135–145)

## 2021-08-01 LAB — GLUCOSE, CAPILLARY
Glucose-Capillary: 179 mg/dL — ABNORMAL HIGH (ref 70–99)
Glucose-Capillary: 185 mg/dL — ABNORMAL HIGH (ref 70–99)
Glucose-Capillary: 188 mg/dL — ABNORMAL HIGH (ref 70–99)
Glucose-Capillary: 212 mg/dL — ABNORMAL HIGH (ref 70–99)
Glucose-Capillary: 273 mg/dL — ABNORMAL HIGH (ref 70–99)
Glucose-Capillary: 284 mg/dL — ABNORMAL HIGH (ref 70–99)

## 2021-08-01 LAB — CBC
HCT: 33.6 % — ABNORMAL LOW (ref 39.0–52.0)
Hemoglobin: 11 g/dL — ABNORMAL LOW (ref 13.0–17.0)
MCH: 30.6 pg (ref 26.0–34.0)
MCHC: 32.7 g/dL (ref 30.0–36.0)
MCV: 93.3 fL (ref 80.0–100.0)
Platelets: 166 10*3/uL (ref 150–400)
RBC: 3.6 MIL/uL — ABNORMAL LOW (ref 4.22–5.81)
RDW: 14.2 % (ref 11.5–15.5)
WBC: 8.8 10*3/uL (ref 4.0–10.5)
nRBC: 0 % (ref 0.0–0.2)

## 2021-08-01 LAB — TRIGLYCERIDES: Triglycerides: 116 mg/dL (ref <150)

## 2021-08-01 LAB — ECHOCARDIOGRAM COMPLETE
Area-P 1/2: 3.21 cm2
Calc EF: 59.2 %
Height: 72 in
S' Lateral: 3.35 cm
Single Plane A2C EF: 51.2 %
Single Plane A4C EF: 65.9 %
Weight: 4800 oz

## 2021-08-01 MED ORDER — GUAIFENESIN-DM 100-10 MG/5ML PO SYRP: 15.0000 mL | ORAL_SOLUTION | ORAL | Status: DC | PRN

## 2021-08-01 MED ORDER — AMLODIPINE BESYLATE 5 MG PO TABS: 2.5000 mg | ORAL_TABLET | Freq: Every day | ORAL | Status: DC

## 2021-08-01 MED ORDER — CHLORHEXIDINE GLUCONATE 0.12 % MT SOLN
15.0000 mL | Freq: Two times a day (BID) | OROMUCOSAL | Status: DC
  Administered 2021-08-02 – 2021-08-03 (×3): 15 mL via OROMUCOSAL
  Filled 2021-08-01 (×4): qty 15

## 2021-08-01 MED ORDER — ORAL CARE MOUTH RINSE
15.0000 mL | Freq: Two times a day (BID) | OROMUCOSAL | Status: DC
  Administered 2021-08-01 – 2021-08-03 (×4): 15 mL via OROMUCOSAL

## 2021-08-01 MED ORDER — AMLODIPINE BESYLATE 5 MG PO TABS: 5.0000 mg | ORAL_TABLET | Freq: Every day | ORAL | Status: DC

## 2021-08-01 MED ORDER — AMLODIPINE BESYLATE 5 MG PO TABS
5.0000 mg | ORAL_TABLET | Freq: Every day | ORAL | Status: DC
  Administered 2021-08-01 – 2021-08-02 (×2): 5 mg via ORAL
  Filled 2021-08-01 (×2): qty 1

## 2021-08-01 MED ORDER — ACETAMINOPHEN 325 MG RE SUPP
325.0000 mg | RECTAL | Status: DC | PRN
  Filled 2021-08-01: qty 2

## 2021-08-01 MED ORDER — SODIUM CHLORIDE 0.9 % IV SOLN
INTRAVENOUS | Status: DC | PRN
  Administered 2021-08-01: 500 mL via INTRAVENOUS

## 2021-08-01 MED ORDER — PERFLUTREN LIPID MICROSPHERE
1.0000 mL | INTRAVENOUS | Status: AC | PRN
  Administered 2021-08-01: 2 mL via INTRAVENOUS
  Filled 2021-08-01: qty 10

## 2021-08-01 MED ORDER — PANTOPRAZOLE SODIUM 40 MG PO TBEC
40.0000 mg | DELAYED_RELEASE_TABLET | Freq: Every day | ORAL | Status: DC
  Administered 2021-08-01 – 2021-08-03 (×3): 40 mg via ORAL
  Filled 2021-08-01 (×3): qty 1

## 2021-08-01 MED ORDER — OXYCODONE-ACETAMINOPHEN 5-325 MG PO TABS
1.0000 | ORAL_TABLET | ORAL | Status: DC | PRN
  Administered 2021-08-01: 1 via ORAL
  Administered 2021-08-02 (×2): 2 via ORAL
  Filled 2021-08-01: qty 2
  Filled 2021-08-01: qty 1
  Filled 2021-08-01: qty 2

## 2021-08-01 MED ORDER — ASPIRIN 81 MG PO CHEW
81.0000 mg | CHEWABLE_TABLET | Freq: Every day | ORAL | Status: DC
  Administered 2021-08-02 – 2021-08-03 (×2): 81 mg via ORAL
  Filled 2021-08-01 (×2): qty 1

## 2021-08-01 MED ORDER — ACETAMINOPHEN 325 MG PO TABS
325.0000 mg | ORAL_TABLET | ORAL | Status: DC | PRN
  Administered 2021-08-01 – 2021-08-03 (×3): 650 mg via ORAL
  Filled 2021-08-01 (×3): qty 2

## 2021-08-01 MED ORDER — TAMSULOSIN HCL 0.4 MG PO CAPS
0.4000 mg | ORAL_CAPSULE | Freq: Every day | ORAL | Status: DC
  Administered 2021-08-01 – 2021-08-02 (×2): 0.4 mg via ORAL
  Filled 2021-08-01 (×2): qty 1

## 2021-08-01 MED ORDER — ALUM & MAG HYDROXIDE-SIMETH 200-200-20 MG/5ML PO SUSP: 15.0000 mL | ORAL | Status: DC | PRN

## 2021-08-01 NOTE — Evaluation (Signed)
Physical Therapy Evaluation & Discharge ?Patient Details ?Name: Mario Green ?MRN: [id number] ?DOB: 28-Sep-1955 ?Today's Date: 08/01/2021 ? ?History of Present Illness ? Mario Green is a 66 y.o. male who presented 07/31/21 for elective right carotid endarterectomy with bovine pericardial patch angioplasty. After procedure, Mario Green developed R neck hematoma, s/p evacuation of hematoma and placement of JP drain 4/25 in which Mario Green remained intubated afterwards. Extubated 4/26. PMH: anemia, HTN, CAD, CKD, HLD, OSA, s/p CABG, DM2, 2nd degree atrioventricular block Mobitz type I ?  ?Clinical Impression ? Mario Green presents with condition above. PTA, he was IND without DME, living with his SO in a 1-level house with a ramp entrance. Currently, Mario Green demonstrates [facility] and symmetrical bil upper and lower extremity strength and sensation. He initially had a mild trunk sway upon first coming to stand, but as distance ambulated progressed his balance and fluidity and speed with gait improved to [name] with no need for UE support or assistance. Mario Green with mildly slow processing at times, but likely from still being groggy s/p being extubated this morning. Mario Green is close to if not back at his baseline, thus no further Mario Green needs identified. All education completed and questions answered. Mario Green will sign off.   ?   ? ?Recommendations for follow up therapy are one component of a multi-disciplinary discharge planning process, led by the attending physician.  Recommendations may be updated based on patient status, additional functional criteria and insurance authorization. ? ?Follow Up Recommendations No Mario Green follow up ? ?  ?Assistance Recommended at Discharge None  ?Patient can return home with the following ?  (N/A) ? ?  ?Equipment Recommendations None recommended by Mario Green  ?Recommendations for Other Services ?    ?  ?Functional Status Assessment Patient has not had a recent decline in their functional status  ? ?  ?Precautions / Restrictions Precautions ?Precautions: Other  (comment) ?Precaution Comments: watch SpO2 ?Restrictions ?Weight Bearing Restrictions: No  ? ?  ? ?Mobility ? Bed Mobility ?Overal bed mobility: Modified Independent ?  ?  ?  ?  ?  ?  ?General bed mobility comments: Mario Green able to transition supine > sit L EOB with HOB elevated without assistance. ?  ? ?Transfers ?Overall transfer level: Needs assistance ?Equipment used: None ?Transfers: Sit to/from Stand ?Sit to Stand: Supervision ?  ?  ?  ?  ?  ?General transfer comment: Supervision for safety, no LOB. ?  ? ?Ambulation/Gait ?Ambulation/Gait assistance: Min guard, Supervision ?Gait Distance (Feet): 410 Feet ?Assistive device: None ?Gait Pattern/deviations: WFL(Within Functional Limits) ?Gait velocity: WNL ?Gait velocity interpretation: >4.37 ft/sec, indicative of normal walking speed ?  ?General Gait Details: Mario Green initially with very mild trunk sway, but balance, fluidity and speed with gait improved as distance progressed. No LOB, even with changing speeds, directions, or head positions, min guard-supervision for safety secondary to first time being up since surgery. ? ?Stairs ?  ?  ?  ?  ?  ? ?Wheelchair Mobility ?  ? ?Modified Rankin (Stroke Patients Only) ?  ? ?  ? ?Balance Overall balance assessment: No apparent balance deficits (not formally assessed) (mild deficits initially but progressed to none) ?  ?  ?  ?  ?  ?  ?  ?  ?  ?  ?  ?  ?  ?  ?  ?  ?  ?  ?   ? ? ? ?Pertinent Vitals/Pain Pain Assessment ?Pain Assessment: Faces ?Faces Pain Scale: Hurts a little bit ?Pain Location: R neck surgical site ?  Pain Descriptors / Indicators: Discomfort ?Pain Intervention(s): Limited activity within patient's tolerance, Monitored during session, Repositioned  ? ? ?Home Living Family/patient expects to be discharged to:: Private residence ?Living Arrangements: Spouse/significant other ?Available Help at Discharge: Family;Available 24 hours/day ?Type of Home: House ?Home Access: Ramped entrance ?  ?  ?  ?Home Layout: One  level ?Home Equipment: Grab bars - tub/shower ?   ?  ?Prior Function Prior Level of Function : Independent/Modified Independent;Driving ?  ?  ?  ?  ?  ?  ?Mobility Comments: No AD. ?  ?  ? ? ?Hand Dominance  ?   ? ?  ?Extremity/Trunk Assessment  ? Upper Extremity Assessment ?Upper Extremity Assessment: Overall WFL for tasks assessed (MMT scores of 4+ to 5 grossly bil; denied numbness/tingling bil) ?  ? ?Lower Extremity Assessment ?Lower Extremity Assessment: Overall WFL for tasks assessed (MMT scores of 4+ to 5 grossly bil; denied numbness/tingling bil; edema noted bil legs) ?  ? ?Cervical / Trunk Assessment ?Cervical / Trunk Assessment: Other exceptions ?Cervical / Trunk Exceptions: increased body habitus  ?Communication  ? Communication: No difficulties  ?Cognition Arousal/Alertness: Awake/alert ?Behavior During Therapy: [facility] for tasks assessed/performed ?Overall Cognitive Status: Within Functional Limits for tasks assessed ?  ?  ?  ?  ?  ?  ?  ?  ?  ?  ?  ?  ?  ?  ?  ?  ?General Comments: A&Ox4, slightly slow processing at times but likely due to being a little groggy still ?  ?  ? ?  ?General Comments General comments (skin integrity, edema, etc.): SpO2 down to 89% on RA with gait but recovered to 93% quickly; educated Mario Green to elevate legs to manage edema ? ?  ?Exercises    ? ?Assessment/Plan  ?  ?Mario Green Assessment Patient does not need any further Mario Green services  ?Mario Green Problem List   ? ?   ?  ?Mario Green Treatment Interventions     ? ?Mario Green Goals (Current goals can be found in the Care Plan section)  ?Acute Rehab Mario Green Goals ?Patient Stated Goal: to go home tomorrow ?Mario Green Goal Formulation: With patient/family ?Time For Goal Achievement: 08/02/21 ?Potential to Achieve Goals: Good ? ?  ?Frequency   ?  ? ? ?Co-evaluation   ?  ?  ?  ?  ? ? ?  ?AM-PAC Mario Green "6 Clicks" Mobility  ?Outcome Measure Help needed turning from your back to your side while in a flat bed without using bedrails?: None ?Help needed moving from lying on your back to sitting  on the side of a flat bed without using bedrails?: None ?Help needed moving to and from a bed to a chair (including a wheelchair)?: A Little ?Help needed standing up from a chair using your arms (e.g., wheelchair or bedside chair)?: A Little ?Help needed to walk in hospital room?: A Little ?Help needed climbing 3-5 steps with a railing? : A Little ?6 Click Score: 20 ? ?  ?End of Session Equipment Utilized During Treatment: Gait belt ?Activity Tolerance: Patient tolerated treatment well ?Patient left: in chair;with call bell/phone within reach;with family/visitor present ?Nurse Communication: Mobility status;Other (comment) (sats) ?Mario Green Visit Diagnosis: Unsteadiness on feet (R26.81) ?  ? ?Time: 7341-9379 ?Mario Green Time Calculation (min) (ACUTE ONLY): 21 min ? ? ?Charges:   Mario Green Evaluation ?$Mario Green Eval Low Complexity: 1 Low ?  ?  ?   ? ? ?Mario Green, Mario Green, Mario Green ?Acute Rehabilitation Services  ?Pager: [phone] ?Office: [phone] ? ? ?Maretta Bees  Pettis ?08/01/2021, 12:31 PM ? ?

## 2021-08-01 NOTE — TOC Benefit Eligibility Note (Signed)
Patient Advocate Encounter ? ?Insurance verification completed.   ? ?The patient is currently admitted and upon discharge could be taking Vascepa 1 g capsule. ? ?The current 30 day co-pay is, $45.00.  ? ?The patient is insured through [employer] Part D  ? ? ? ?Lyndel Safe, CPhT ?Pharmacy Patient Advocate Specialist ?[place] Patient Advocate Team ?Direct Number: [phone]  Fax: [phone] ? ? ? ? ? ?  ?

## 2021-08-01 NOTE — Procedures (Signed)
Extubation Procedure Note ? ?Patient Details:   ?Name: Mario Green ?DOB: 30-Aug-1955 ?MRN: [id number] ?  ?Airway Documentation:  ?  ?Vent end date: 08/01/21 Vent end time: 1015  ? ?Evaluation ? O2 sats: stable throughout ?Complications: No apparent complications ?Patient did tolerate procedure well. ?Bilateral Breath Sounds: Clear ?  ?Yes ? ?Patient was extubated to a 4L [place] without any complications, dyspnea or stridor noted. Positive cuff leak prior to extubation.  ? ?Claretta Fraise ?08/01/2021, 10:15 AM ? ?

## 2021-08-01 NOTE — Progress Notes (Signed)
?  Transition of Care (TOC) Screening Note ? ? ?Patient Details  ?Name: Mario Green ?Date of Birth: [birth year] ? ? ?Transition of Care (TOC) CM/SW Contact:    ?Milas Gain, LCSWA ?Phone Number: ?08/01/2021, 3:56 PM ? ? ? ?Transition of Care Department [facility]) has reviewed patient and no TOC needs have been identified at this time. We will continue to monitor patient advancement through interdisciplinary progression rounds. If new patient transition needs arise, please place a TOC consult. ?  ?

## 2021-08-01 NOTE — Progress Notes (Signed)
? ?NAME:  Mario Green, MRN:  [id number], DOB:  10-Apr-1955, LOS: 1 ?ADMISSION DATE:  07/31/2021, CONSULTATION DATE:  4/25 ?REFERRING MD:  Donzetta Matters, REASON FOR CONSULT:  Post operative vent and medical management ? ?History of Present Illness:  ?Patient is intubated. Therefore history has been obtained from chart review.  ? ?Mario Green, is a 66 y.o. male, who presented to [facility] on 4/25 for a planned right carotid endarterectomy with Dr. Donzetta Matters. ? ?The procedure was completed without issue. Post-operatively he developed a hematoma of the right neck for which he was taken back to the OR for a take down. A hematoma was evacuated and JP drain was placed. He remained on the ventilator post-op due to an absence of a cuff leak. ? ?PCCM was consulted for assistance with ventilator and medical management ? ? ?Pertinent  Medical History  ?Carotid stenosis, CAD, HTN, HX CABG,  HLD, OSA, DM2, obesity  ? ?Significant Hospital Events: ?Including procedures, antibiotic start and stop dates in addition to other pertinent events   ?4/25 Presented to [facility] for right carotid endarterectomy . Taken back to OR for hematoma, PCCM consult ? ?Interim History / Subjective:  ?Sedation was stopped this morning ?Patient is awake and following commands ?Right-sided neck drain was removed by vascular surgery ?Remained afebrile ? ?Objective   ?Blood pressure (!) 146/93, pulse (!) 59, temperature 98.9 ?F (37.2 ?C), temperature source Oral, resp. rate 19, height 6' (1.829 m), weight 136.1 kg, SpO2 99 %. ?   ?Vent Mode: PRVC ?FiO2 (%):  [40 %-50 %] 40 % ?Set Rate:  [16 bmp] 16 bmp ?Vt Set:  [560 mL-620 mL] 620 mL ?PEEP:  [5 cmH20] 5 cmH20 ?Plateau Pressure:  [15 cmH20-17 cmH20] 15 cmH20  ? ?Intake/Output Summary (Last 24 hours) at 08/01/2021 1001 ?Last data filed at 08/01/2021 0700 ?Gross per 24 hour  ?Intake 3353.62 ml  ?Output 1850 ml  ?Net 1503.62 ml  ? ?Filed Weights  ? 07/31/21 0609  ?Weight: 136.1 kg  ? ? ?Examination: ?Physical exam: ?General: Acute  on chronically chronically ill-appearing morbidly obese male, lying on the bed, orally intubated ?HEENT: [place]/AT, eyes anicteric.  moist mucus membranes.  ETT and OGT in place ?Neuro: Alert, awake following commands ?Chest: Coarse breath sounds, no wheezes or rhonchi ?Heart: Regular rate and rhythm, no murmurs or gallops ?Abdomen: Soft, nontender, nondistended, bowel sounds present ?Skin: No rash ? ?Resolved Hospital Problem list   ? ? ?Assessment & Plan:  ?Acute hypoxic respiratory failure secondary to compression of airway caused by right-sided neck hematoma s/p evacuation ?Obstructive sleep apnea ?Patient is tolerating spontaneous breathing trial ?Sedation was turned off this morning ?We will try to extubate him this morning ?After extubation needs BiPAP/CPAP at night ? ?Carotid stenosis s/p right carotid endarterectomy postop day 1 ?Right neck hematoma s/p OR for hematoma evacuation ?Vascular surgery is following ?JP drain drain was removed this morning by vascular surgery ?Continue aspirin, holding Plavix ? ?Coronary artery disease s/p CABG ?Hypertension ?Hyperlipidemia ?Continue aspirin, holding Plavix ?Resume oral antihypertensive meds ? ?DM2 ?Continue Levemir and sliding scale insulin ?Blood Glucose goal 140-180. ? ?CKD stage 3B ?Serum creatinine is at baseline ?Avoid nephrotoxic agents ? ?Morbid obesity ?Dietitian follow-up ? ?Best Practice (right click and "Reselect all SmartList Selections" daily)  ? ?Diet/type: NPO, swallow evaluation postextubation ?DVT prophylaxis: SCD ?GI prophylaxis: PPI ?Lines: N/A ?Foley:  N/A ?Code Status:  full code ?Last date of multidisciplinary goals of care discussion [Per primary team] ? ?Labs   ?CBC: ?Recent  Labs  ?Lab 07/31/21 ?4975 07/31/21 ?1651 08/01/21 ?0357  ?WBC  --  9.0 8.8  ?HGB 11.9* 11.6*  11.2* 11.0*  ?HCT 35.0* 34.0*  34.7* 33.6*  ?MCV  --  94.0 93.3  ?PLT  --  175 166  ? ? ?Basic Metabolic Panel: ?Recent Labs  ?Lab 07/31/21 ?3005 07/31/21 ?1651 08/01/21 ?1102   ?NA 142 140  138 138  ?K 4.6 4.8  4.7 4.0  ?CL 109 110 109  ?CO2  --  20* 19*  ?GLUCOSE 162* 212* 193*  ?BUN 48* 47* 45*  ?CREATININE 2.40* 2.25* 2.24*  ?CALCIUM  --  8.4* 8.0*  ?MG  --  1.8  --   ? ?GFR: ?Estimated Creatinine Clearance: 45.7 mL/min (A) (by C-G formula based on SCr of 2.24 mg/dL (H)). ?Recent Labs  ?Lab 07/31/21 ?1651 08/01/21 ?1117  ?WBC 9.0 8.8  ? ? ?Liver Function Tests: ?No results for input(s): AST, ALT, ALKPHOS, BILITOT, PROT, ALBUMIN in the last 168 hours. ?No results for input(s): LIPASE, AMYLASE in the last 168 hours. ?No results for input(s): AMMONIA in the last 168 hours. ? ?ABG ?   ?Component Value Date/Time  ? PHART 7.355 07/31/2021 1651  ? PCO2ART 37.5 07/31/2021 1651  ? PO2ART 79 (L) 07/31/2021 1651  ? HCO3 21.1 07/31/2021 1651  ? TCO2 22 07/31/2021 1651  ? ACIDBASEDEF 4.0 (H) 07/31/2021 1651  ? O2SAT 95 07/31/2021 1651  ?  ? ?Coagulation Profile: ?No results for input(s): INR, PROTIME in the last 168 hours. ? ?Cardiac Enzymes: ?No results for input(s): CKTOTAL, CKMB, CKMBINDEX, TROPONINI in the last 168 hours. ? ?HbA1C: ?Hgb A1c MFr Bld  ?Date/Time Value Ref Range Status  ?07/24/2021 12:00 PM 7.0 (H) 4.8 - 5.6 % Final  ?  Comment:  ?  (NOTE) ?Pre diabetes:          5.7%-6.4% ? ?Diabetes:              >6.4% ? ?Glycemic control for   <7.0% ?adults with diabetes ?  ? ? ?CBG: ?Recent Labs  ?Lab 07/31/21 ?1839 07/31/21 ?1929 08/01/21 ?0009 08/01/21 ?3567 08/01/21 ?0141  ?GLUCAP 229* 225* 212* 185* 188*  ? ?Critical care time:  ?  ? ?Total critical care time: 37 minutes ? ?Performed by: Mario Green ?  ?Critical care time was exclusive of separately billable procedures and treating other patients. ?  ?Critical care was necessary to treat or prevent imminent or life-threatening deterioration. ?  ?Critical care was time spent personally by me on the following activities: development of treatment plan with patient and/or surrogate as well as nursing, discussions with consultants,  evaluation of patient's response to treatment, examination of patient, obtaining history from patient or surrogate, ordering and performing treatments and interventions, ordering and review of laboratory studies, ordering and review of radiographic studies, pulse oximetry and re-evaluation of patient's condition. ?  ?Mario Kindle MD ?[facility] ?See Amion for pager ?If no response to pager, please call [phone] until 7pm ?After 7pm, Please call E-link [phone] ? ? ? ? ? ? ?

## 2021-08-01 NOTE — Progress Notes (Signed)
Inpatient Diabetes Program Recommendations ? ?AACE/ADA: New Consensus Statement on Inpatient Glycemic Control (2015) ? ?Target Ranges:  Prepandial:   less than 140 mg/dL ?     Peak postprandial:   less than 180 mg/dL (1-2 hours) ?     Critically ill patients:  140 - 180 mg/dL  ? ?Lab Results  ?Component Value Date  ? GLUCAP 188 (H) 08/01/2021  ? HGBA1C 7.0 (H) 07/24/2021  ? ? ?Review of Glycemic Control ? Latest Reference Range & Units 07/31/21 10:27 07/31/21 13:10 07/31/21 18:39 07/31/21 19:29 08/01/21 00:09 08/01/21 03:57 08/01/21 07:35 08/01/21 11:11  ?Glucose-Capillary 70 - 99 mg/dL 158 (H) 196 (H) 229 (H) 225 (H) 212 (H) 185 (H) 188 (H) 179 (H)  ?(H): Data is abnormally high ? ?Diabetes history: DM2 ?Outpatient Diabetes medications: Levemir 36 units qd, Novolog 43-83 units tid, Trulicity 4.5 q week ?Current orders for Inpatient glycemic control: Levemir 36 units qd, Novolog 0-15 units q 4 hrs. ? ?Inpatient Diabetes Program Recommendations:   ?Please consider while NPO: ?-Decrease Levemir to 18 units qd (50% home basal dose) ?-Decrease Novolog correction to 0-9 units q 4 hrs. ?Secure chat sent to Dr. Donzetta Matters. ? ?Thank you, ?Nani Gasser Naiyah Klostermann, RN, MSN, CDE  ?Diabetes Coordinator ?Inpatient Glycemic Control Team ?Team Pager [phone] (8am-5pm) ?08/01/2021 11:16 AM ? ? ? ? ?

## 2021-08-01 NOTE — Progress Notes (Signed)
08/01/2021 6:49 PM ?Received pt to room 4E-18 from [facility].  Pt is A&O, no C/O voiced.  Tele monitor applied and CCMD notified.  CHG bath given.  Oriented to room, call light and bed.  Call bell in reach, family at bedside. ?Brion Aliment C ? ? ? ?

## 2021-08-01 NOTE — Anesthesia Postprocedure Evaluation (Signed)
Anesthesia Post Note ? ?Patient: Mario Green ? ?Procedure(s) Performed: RIGHT ENDARTERECTOMY CAROTID WITH PATCH ANGIOPLASTY (Right: Neck) ? ?  ? ?Patient location during evaluation: PACU ?Anesthesia Type: General ?Level of consciousness: awake and alert ?Pain management: pain level controlled ?Vital Signs Assessment: post-procedure vital signs reviewed and stable ?Respiratory status: spontaneous breathing, nonlabored ventilation, respiratory function stable and patient connected to nasal cannula oxygen ?Cardiovascular status: blood pressure returned to baseline and stable ?Postop Assessment: no apparent nausea or vomiting ?Anesthetic complications: no ?Comments: Pt with expanding neck hematoma. Back to OR for exploration. ? ? ?No notable events documented. ? ?Last Vitals:  ?Vitals:  ? 08/01/21 0900 08/01/21 0930  ?BP: (!) 146/93   ?Pulse: 76 (!) 59  ?Resp: 19   ?Temp:    ?SpO2: 98% 99%  ?  ?Last Pain:  ?Vitals:  ? 08/01/21 0858  ?TempSrc: Oral  ?PainSc:   ? ? ?  ?  ?  ?  ?  ?  ? ?Suzette Battiest E ? ? ? ? ?

## 2021-08-01 NOTE — Progress Notes (Signed)
?  Progress Note ? ? ? ?08/01/2021 ?8:13 AM ?1 Day Post-Op ? ?Subjective: Remains intubated and sedated but following commands ? ?Vitals:  ? 08/01/21 0730 08/01/21 0802  ?BP:  (!) 160/67  ?Pulse: (!) 53 (!) 47  ?Resp: 16 16  ?Temp:    ?SpO2: 99% 100%  ? ? ?Physical Exam: ?He is able to move all extremities to command and nod his head yes and no ?He is intubated and sedated ?Right neck does have swelling although soft there is no drainage in the drain and this was removed at bedside ? ?CBC ?   ?Component Value Date/Time  ? WBC 8.8 08/01/2021 0357  ? RBC 3.60 (L) 08/01/2021 0357  ? HGB 11.0 (L) 08/01/2021 0357  ? HCT 33.6 (L) 08/01/2021 0357  ? PLT 166 08/01/2021 0357  ? MCV 93.3 08/01/2021 0357  ? MCH 30.6 08/01/2021 0357  ? MCHC 32.7 08/01/2021 0357  ? RDW 14.2 08/01/2021 0357  ? ? ?BMET ?   ?Component Value Date/Time  ? NA 138 08/01/2021 0357  ? K 4.0 08/01/2021 0357  ? CL 109 08/01/2021 0357  ? CO2 19 (L) 08/01/2021 0357  ? GLUCOSE 193 (H) 08/01/2021 0357  ? BUN 45 (H) 08/01/2021 0357  ? CREATININE 2.24 (H) 08/01/2021 0357  ? CALCIUM 8.0 (L) 08/01/2021 0357  ? GFRNONAA 31 (L) 08/01/2021 0357  ? ? ?INR ?   ?Component Value Date/Time  ? INR 1.0 07/24/2021 1200  ? ? ? ?Intake/Output Summary (Last 24 hours) at 08/01/2021 0813 ?Last data filed at 08/01/2021 0700 ?Gross per 24 hour  ?Intake 3353.62 ml  ?Output 1900 ml  ?Net 1453.62 ml  ? ? ? ?Assessment:  66 y.o. male is s/p right carotid endarterectomy for asymptomatic disease requiring takeback for washout of hematoma now intubated for airway protection overnight he did have bradycardia with known Mobitz 1 second-degree AV block ?1 Day Post-Op ? ?Plan: ?Airway management per critical care, their assistance is much appreciated ?Follow-up today for possible extubation ? ? ?Dollye Glasser C. Donzetta Matters, MD ?[facility] ?Office: [phone] ?Pager: [phone] ? ?08/01/2021 ?8:13 AM ? ?

## 2021-08-01 NOTE — Progress Notes (Signed)
?  Echocardiogram ?2D Echocardiogram has been performed. ? ?Mario Green ?08/01/2021, 8:41 AM ?

## 2021-08-02 ENCOUNTER — Inpatient Hospital Stay (HOSPITAL_COMMUNITY): Payer: Medicare HMO

## 2021-08-02 LAB — GLUCOSE, CAPILLARY
Glucose-Capillary: 160 mg/dL — ABNORMAL HIGH (ref 70–99)
Glucose-Capillary: 116 mg/dL — ABNORMAL HIGH (ref 70–99)
Glucose-Capillary: 162 mg/dL — ABNORMAL HIGH (ref 70–99)
Glucose-Capillary: 154 mg/dL — ABNORMAL HIGH (ref 70–99)
Glucose-Capillary: 251 mg/dL — ABNORMAL HIGH (ref 70–99)
Glucose-Capillary: 341 mg/dL — ABNORMAL HIGH (ref 70–99)

## 2021-08-02 LAB — BASIC METABOLIC PANEL
Anion gap: 5 (ref 5–15)
BUN: 37 mg/dL — ABNORMAL HIGH (ref 8–23)
CO2: 25 mmol/L (ref 22–32)
Calcium: 8.4 mg/dL — ABNORMAL LOW (ref 8.9–10.3)
Chloride: 113 mmol/L — ABNORMAL HIGH (ref 98–111)
Creatinine, Ser: 2.31 mg/dL — ABNORMAL HIGH (ref 0.61–1.24)
GFR, Estimated: 30 mL/min — ABNORMAL LOW (ref >60)
Glucose, Bld: 133 mg/dL — ABNORMAL HIGH (ref 70–99)
Potassium: 4.4 mmol/L (ref 3.5–5.1)
Sodium: 143 mmol/L (ref 135–145)

## 2021-08-02 LAB — LIPID PANEL
Cholesterol: 120 mg/dL (ref 0–200)
HDL: 25 mg/dL — ABNORMAL LOW (ref >40)
LDL Cholesterol: 68 mg/dL (ref 0–99)
Total CHOL/HDL Ratio: 4.8 RATIO
Triglycerides: 136 mg/dL (ref <150)
VLDL: 27 mg/dL (ref 0–40)

## 2021-08-02 MED ORDER — HYDROCORTISONE SOD SUC (PF) 100 MG IJ SOLR
100.0000 mg | Freq: Two times a day (BID) | INTRAMUSCULAR | Status: DC
Start: 2021-08-02 — End: 2021-08-03
  Administered 2021-08-02 – 2021-08-03 (×3): 100 mg via INTRAVENOUS
  Filled 2021-08-02 (×5): qty 2

## 2021-08-02 NOTE — Anesthesia Postprocedure Evaluation (Addendum)
Anesthesia Post Note ? ?Patient: JAMAUL HEIST ? ?Procedure(s) Performed: Hematoma Evacuation Right Post Carotid Endarterectomy (Right: Neck) ? ?  ? ?Patient location during evaluation: SICU ?Anesthesia Type: General ?Level of consciousness: sedated and patient remains intubated per anesthesia plan ?Pain management: pain level controlled ?Vital Signs Assessment: post-procedure vital signs reviewed and stable ?Respiratory status: patient remains intubated per anesthesia plan and patient on ventilator - see flowsheet for VS ?Cardiovascular status: stable ?Anesthetic complications: no ? ? ?No notable events documented. ? ?Last Vitals:  ?Vitals:  ? 08/02/21 1637 08/02/21 2012  ?BP: (!) 165/60 (!) 156/67  ?Pulse: 70 75  ?Resp: 18 20  ?Temp: 36.7 ?C 36.9 ?C  ?SpO2: 96% 100%  ?  ?Last Pain:  ?Vitals:  ? 08/02/21 2012  ?TempSrc: Oral  ?PainSc:   ? ? ?  ?  ?  ?  ?  ?  ? ?Nolon Nations ? ? ? ? ?

## 2021-08-02 NOTE — Progress Notes (Signed)
Inpatient Diabetes Program Recommendations ? ?AACE/ADA: New Consensus Statement on Inpatient Glycemic Control (2015) ? ?Target Ranges:  Prepandial:   less than 140 mg/dL ?     Peak postprandial:   less than 180 mg/dL (1-2 hours) ?     Critically ill patients:  140 - 180 mg/dL  ? ?Lab Results  ?Component Value Date  ? GLUCAP 162 (H) 08/02/2021  ? HGBA1C 7.0 (H) 07/24/2021  ? ? ?Review of Glycemic Control ? Latest Reference Range & Units 08/01/21 19:41 08/01/21 23:35 08/02/21 03:45  ?Glucose-Capillary 70 - 99 mg/dL 284 (H) 160 (H) 116 (H)  ? ?Diabetes history: DM 2 ?Outpatient Diabetes medications:  ?Trulicity 4.5 mg q Monday ?Novolog 10-12 units tid with meals ?Levemir 36 units daily ?Current orders for Inpatient glycemic control:  ?Novolog moderate q 4 hours ?Solu-cortef 100 mg bid ?Levemir 36 units daily ? ?Inpatient Diabetes Program Recommendations:   ?Solucortef added.  ?Consider adding Novolog meal coverage 4 units tid with meals (hold if patient eats less than 50% or NPO).  ? ?Thanks,  ?Adah Perl, RN, BC-ADM ?Inpatient Diabetes Coordinator ?Pager [phone]  (8a-5p) ? ? ?

## 2021-08-02 NOTE — Evaluation (Signed)
Clinical/Bedside Swallow Evaluation ?Patient Details  ?Name: Mario Green ?MRN: [id number] ?Date of Birth: 1956/03/07 ? ?Today's Date: 08/02/2021 ?Time: SLP Start Time (ACUTE ONLY): [id number] SLP Stop Time (ACUTE ONLY): 1638 ?SLP Time Calculation (min) (ACUTE ONLY): 15 min ? ?Past Medical History:  ?Past Medical History:  ?Diagnosis Date  ? Allergic rhinitis   ? Anemia   ? Benign essential hypertension   ? CAD (coronary artery disease)   ? CKD (chronic kidney disease), symptom management only, stage 3 (moderate) (HCC)   ? Dyslipidemia 03/24/2015  ? ED (erectile dysfunction)   ? Hyperlipidemia   ? Morbid obesity due to excess calories ([place]) 03/24/2015  ? Obstructive sleep apnea 03/24/2015  ? patient denied having a sleep study done  ? Renal insufficiency   ? Second degree atrioventricular block, Mobitz (type) I 07/17/2021  ? 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 Formatting of this note might be different from the original. LIMA to LAD, SVG to diagonal 1, SVG to PDA  ? Tinnitus, bilateral   ? Type 2 diabetes mellitus without complication ([place]) 46/65/9935  ? ?Past Surgical History:  ?Past Surgical History:  ?Procedure Laterality Date  ? ADENOIDECTOMY    ? CARDIAC CATHETERIZATION    ? CARDIAC SURGERY    ? Tripple bypass  ? COLONOSCOPY    ? ENDARTERECTOMY Right 07/31/2021  ? Procedure: Hematoma Evacuation Right Post Carotid Endarterectomy;  Surgeon: Waynetta Sandy, MD;  Location: [place];  Service: Vascular;  Laterality: Right;  ? ENDARTERECTOMY Right 07/31/2021  ? Procedure: RIGHT ENDARTERECTOMY CAROTID WITH PATCH ANGIOPLASTY;  Surgeon: Waynetta Sandy, MD;  Location: [facility];  Service: Vascular;  Laterality: Right;  ? ?HPI:  ?Pt is a 66 y.o. male who presented 07/31/21 for elective right carotid endarterectomy with bovine pericardial patch angioplasty. After procedure, pt developed R neck hematoma, s/p evacuation of hematoma and placement of JP drain 4/25 in which pt remained intubated  afterwards. Extubated 4/26. PMH: anemia, HTN, CAD, CKD, HLD, OSA, s/p CABG, DM2, 2nd degree atrioventricular block Mobitz type I. He c/o difficulty swallowing and his daughter c/o hoarseness in his voice, prompting SLP swallow evaluation.  ?  ?Assessment / Plan / Recommendation  ?Clinical Impression ? Patient currently presenting with clinical s/s of dysphagia as per this bedside/clinical swallow evaluation. Patient reports his swallowing difficulties started after he was extubated and he did initially have a hoarse voice. He informed SLP that taking pills with applesauce helped as currently "I can't swallow liquids". SLP observed patient with one small sip of thin liquids (water) and patient had immediate throat clear followed by wretching but no regurgitation. When swallowing puree solids (applesauce), swallow appeared [facility] with no cough, throat clear and patient not reporting any c/o discomfort, etc. SLP suspects patient may be suffering from reversable dysphagia caused by possible edema in pharynx from recent surgery and s/p intubation. SLP will proceed with objective swallow study (MBS). ?SLP Visit Diagnosis: Dysphagia, unspecified (R13.10) ?   ?Aspiration Risk ? Mild aspiration risk  ?  ?Diet Recommendation Other (Comment) (continue with PO's as patient tolerates, full recommendations after MBS)  ? ?Liquid Administration via: Cup;Straw ?Medication Administration: Whole meds with puree ?Supervision: Patient able to self feed ?Compensations: Slow rate;Small sips/bites ?Postural Changes: Seated upright at 90 degrees  ?  ?Other  Recommendations Oral Care Recommendations: Oral care BID   ? ?Recommendations for follow up therapy are one component of a multi-disciplinary discharge planning process, led by the attending physician.  Recommendations may be  updated based on patient status, additional functional criteria and insurance authorization. ? ?Follow up Recommendations Other (comment) (TBD pending MBS results)   ? ? ?  ?Assistance Recommended at Discharge None  ?Functional Status Assessment Patient has had a recent decline in their functional status and demonstrates the ability to make significant improvements in function in a reasonable and predictable amount of time.  ?Frequency and Duration min 1 x/week  ?1 week ?  ?   ? ?Prognosis Prognosis for Safe Diet Advancement: Good  ? ?  ? ?Swallow Study   ?General Date of Onset: 08/02/21 ?HPI: Pt is a 66 y.o. male who presented 07/31/21 for elective right carotid endarterectomy with bovine pericardial patch angioplasty. After procedure, pt developed R neck hematoma, s/p evacuation of hematoma and placement of JP drain 4/25 in which pt remained intubated afterwards. Extubated 4/26. PMH: anemia, HTN, CAD, CKD, HLD, OSA, s/p CABG, DM2, 2nd degree atrioventricular block Mobitz type I. He c/o difficulty swallowing and his daughter c/o hoarseness in his voice, prompting SLP swallow evaluation. ?Type of Study: Bedside Swallow Evaluation ?Previous Swallow Assessment: none found ?Diet Prior to this Study: Regular;Thin liquids ?Temperature Spikes Noted: No ?Respiratory Status: Room air ?History of Recent Intubation: Yes ?Length of Intubations (days): 2 days ?Date extubated: 08/01/21 ?Behavior/Cognition: Alert;Cooperative;Pleasant mood ?Oral Cavity Assessment: Within Functional Limits ?Oral Care Completed by SLP: No ?Oral Cavity - Dentition: Adequate natural dentition ?Vision: Functional for self-feeding ?Self-Feeding Abilities: Able to feed self ?Patient Positioning: Upright in bed ?Baseline Vocal Quality: Normal ?Volitional Cough: Strong ?Volitional Swallow: Able to elicit  ?  ?Oral/Motor/Sensory Function Overall Oral Motor/Sensory Function: Within functional limits   ?Ice Chips     ?Thin Liquid Thin Liquid: Impaired ?Presentation: Self Fed;Straw ?Pharyngeal  Phase Impairments: Throat Clearing - Immediate;Other (comments) (immediate throat clear/cough and wretching)  ?  ?Nectar Thick      ?Honey Thick     ?Puree Puree: Within functional limits ?Presentation: Self Fed;Spoon   ?Solid ? ? ?  Solid: Not tested  ? ?  ? ?Sonia Baller, MA, CCC-SLP ?Speech Therapy' ? ?

## 2021-08-02 NOTE — Progress Notes (Addendum)
?  Progress Note ? ? ? ?08/02/2021 ?8:06 AM ?2 Days Post-Op ? ?Subjective: throat pain and feels that he is having trouble breathing and swallowing. Daughter feels he is also very hoarse ? ? ?Vitals:  ? 08/02/21 0345 08/02/21 0749  ?BP: (!) 158/57 (!) 162/68  ?Pulse: 67 67  ?Resp: 16 18  ?Temp: 98.3 ?F (36.8 ?C) 98 ?F (36.7 ?C)  ?SpO2: 94% 97%  ? ?Physical Exam: ?Cardiac:  regular ?Lungs:  non labored. No stridor or wheezing  ?Incisions:  Right neck incision is c/d/I with staples. Extensive ecchymosis along right neck extending to anterior and left neck and to proximal chest, soft without hematoma ?Extremities:  moving all extremities without deficits ?Abdomen:  obese, soft ?Neurologic: alert and oriented. Speech a little horse but coherent. Smile symmetric. Tongue midline ? ?CBC ?   ?Component Value Date/Time  ? WBC 8.8 08/01/2021 0357  ? RBC 3.60 (L) 08/01/2021 0357  ? HGB 11.0 (L) 08/01/2021 0357  ? HCT 33.6 (L) 08/01/2021 0357  ? PLT 166 08/01/2021 0357  ? MCV 93.3 08/01/2021 0357  ? MCH 30.6 08/01/2021 0357  ? MCHC 32.7 08/01/2021 0357  ? RDW 14.2 08/01/2021 0357  ? ? ?BMET ?   ?Component Value Date/Time  ? NA 143 08/02/2021 0136  ? K 4.4 08/02/2021 0136  ? CL 113 (H) 08/02/2021 0136  ? CO2 25 08/02/2021 0136  ? GLUCOSE 133 (H) 08/02/2021 0136  ? BUN 37 (H) 08/02/2021 0136  ? CREATININE 2.31 (H) 08/02/2021 0136  ? CALCIUM 8.4 (L) 08/02/2021 0136  ? GFRNONAA 30 (L) 08/02/2021 0136  ? ? ?INR ?   ?Component Value Date/Time  ? INR 1.0 07/24/2021 1200  ? ? ? ?Intake/Output Summary (Last 24 hours) at 08/02/2021 0806 ?Last data filed at 08/01/2021 2046 ?Gross per 24 hour  ?Intake 1214.36 ml  ?Output 2300 ml  ?Net -1085.64 ml  ? ? ? ?Assessment/Plan:  66 y.o. male is s/p Right CEA complicated by hematoma 2 Days Post-Op  ? ?Right neck incision intact with staples. No bleeding. Soft but with extensive ecchymosis ?Having some difficulty swallowing/ breathing ?O2 Sats okay ?Order placed for Solu-Cortef 50 mg q6h ?Keep head of  bed > 45 ?Will have swallow eval performed as well ?Hemodynamically stable ?Neurologically intact ? ? ?Karoline Caldwell, PA-C ?[facility] ?[phone] ?08/02/2021 ?8:06 AM ? ?I have independently interviewed and examined patient and agree with PA assessment and plan above.  His neck remains very soft but he is having some issues with swallowing.  We will get swallow evaluation begin steroids.  He will keep his head of bed elevated today.  I do not see any issues with air exchange and with his soft hematoma I do not think he needs transfer back to the ICU at this time. ? ?Jaunice Mirza C. Donzetta Matters, MD ?[facility] ?Office: [phone] ?Pager: [phone] ? ?

## 2021-08-02 NOTE — Addendum Note (Signed)
Addendum  created 08/02/21 2048 by Nolon Nations, MD  ? Clinical Note Signed  ?  ?

## 2021-08-02 NOTE — Progress Notes (Signed)
PHARMACIST LIPID MONITORING ? ? ?Mario Green is a 66 y.o. male admitted on 07/31/2021 for CEA. Pharmacy has been consulted to optimize lipid-lowering therapy with the indication of secondary prevention for clinical ASCVD. ? ?Recent Labs: ? ?Lipid Panel (last 6 months):   ?Lab Results  ?Component Value Date  ? CHOL 120 08/02/2021  ? TRIG 136 08/02/2021  ? HDL 25 (L) 08/02/2021  ? CHOLHDL 4.8 08/02/2021  ? VLDL 27 08/02/2021  ? Tate 68 08/02/2021  ? ? ?Hepatic function panel (last 6 months):   ?Lab Results  ?Component Value Date  ? AST 33 07/24/2021  ? ALT 26 07/24/2021  ? ALKPHOS 47 07/24/2021  ? BILITOT 0.8 07/24/2021  ? ? ?SCr (since admission):   ?Serum creatinine: 2.31 mg/dL (H) 08/02/21 0136 ?Estimated creatinine clearance: 44.3 mL/min (A) ? ?Current therapy and lipid therapy tolerance ?Current lipid-lowering therapy: repatha PTA ?Previous lipid-lowering therapies (if applicable): n/a ?Documented or reported allergies or intolerances to lipid-lowering therapies (if applicable): atorvastatin, simvastatin ? ?Assessment:   ?Pt is followed by lipid clinic and takes Repatha PTA - will defer attempting to add a statin at this time ? ?Plan:   ? ?1.Statin intensity (high intensity recommended for all patients regardless of the LDL):  Statin intolerance noted. No statin changes due to serious side effects (ex. Myalgias with at least 2 different statins). ? ?2.Add ezetimibe (if any one of the following):   Not indicated at this time. ? ?3.Refer to lipid clinic:  Pt is established with lipid clinic ? ?4.Follow-up with:  Lipid clinic referral. - pt established here ? ?5.Follow-up labs after discharge:  No changes in lipid therapy, repeat a lipid panel in one year.    ? ? ?Arrie Senate, PharmD, BCPS, BCCP ?Clinical Pharmacist ?[phone] ?Please check AMION for all [place] numbers ?08/02/2021 ? ?

## 2021-08-02 NOTE — Progress Notes (Signed)
The bruising on the right side of the neck appears to have spread across to the left side.  Pt can breathe normally, but said his throat was sore and a little difficult to swallow his pain meds.  Will continue to monitor.  ? ?Lupita Dawn, RN ? ?

## 2021-08-03 ENCOUNTER — Other Ambulatory Visit (HOSPITAL_COMMUNITY): Payer: Self-pay

## 2021-08-03 LAB — GLUCOSE, CAPILLARY
Glucose-Capillary: 175 mg/dL — ABNORMAL HIGH (ref 70–99)
Glucose-Capillary: 251 mg/dL — ABNORMAL HIGH (ref 70–99)
Glucose-Capillary: 264 mg/dL — ABNORMAL HIGH (ref 70–99)
Glucose-Capillary: 378 mg/dL — ABNORMAL HIGH (ref 70–99)

## 2021-08-03 MED ORDER — OXYCODONE-ACETAMINOPHEN 5-325 MG PO TABS
1.0000 | ORAL_TABLET | ORAL | 0 refills | Status: DC | PRN
  Filled 2021-08-03: qty 20, 2d supply, fill #0

## 2021-08-03 NOTE — Progress Notes (Signed)
Speech Language Pathology Treatment: Dysphagia  ?Patient Details ?Name: Mario Green ?MRN: [id number] ?DOB: 06/23/1955 ?Today's Date: 08/03/2021 ?Time: 9702-6378 ?SLP Time Calculation (min) (ACUTE ONLY): 20 min ? ?Assessment / Plan / Recommendation ?Clinical Impression ? Pt subjectively reports feeling much improved today. Advanced trials of solids and thin liquids via straw were administered, throughout which he showed no overt s/s of aspiration or dysphagia. Discussed option of gradually advancing diet at home, and pt is in agreement. Education was reinforced about how to manage this, including watching for coughing in particular, as this was evidence of aspiration per MBS. Encouraged him to also keep his doctor informed as he follow up. Pt and significant other verbalized their understanding and are in agreement with plan. He does not feel like he needs f/u OP SLP at this time.  ?  ?HPI HPI: Pt is a 66 y.o. male who presented 07/31/21 for elective right carotid endarterectomy with bovine pericardial patch angioplasty. After procedure, pt developed R neck hematoma, s/p evacuation of hematoma and placement of JP drain 4/25 in which pt remained intubated afterwards. Extubated 4/26. PMH: anemia, HTN, CAD, CKD, HLD, OSA, s/p CABG, DM2, 2nd degree atrioventricular block Mobitz type I. He c/o difficulty swallowing and his daughter c/o hoarseness in his voice, prompting SLP swallow evaluation. ?  ?   ?SLP Plan ? Continue with current plan of care ? ?  ?  ?Recommendations for follow up therapy are one component of a multi-disciplinary discharge planning process, led by the attending physician.  Recommendations may be updated based on patient status, additional functional criteria and insurance authorization. ?  ? ?Recommendations  ?Diet recommendations: Dysphagia 1 (puree);Thin liquid (advance gradually as tolerated) ?Liquids provided via: Cup;Straw ?Medication Administration: Whole meds with puree ?Supervision: Patient  able to self feed;Intermittent supervision to cue for compensatory strategies ?Compensations: Slow rate;Small sips/bites;Clear throat intermittently;Multiple dry swallows after each bite/sip ?Postural Changes and/or Swallow Maneuvers: Seated upright 90 degrees  ?   ?    ?   ? ? ? ? Oral Care Recommendations: Oral care BID ?Follow Up Recommendations: No SLP follow up ?Assistance recommended at discharge: PRN ?SLP Visit Diagnosis: Dysphagia, pharyngeal phase (R13.13) ?Plan: Continue with current plan of care ? ? ? ? ?  ?  ? ? ?Osie Bond., M.A. CCC-SLP ?Acute Rehabilitation Services ?Office [phone] ? ?Secure chat preferred ? ? ?08/03/2021, 1:19 PM ?

## 2021-08-03 NOTE — TOC Transition Note (Signed)
Transition of Care (TOC) - CM/SW Discharge Note ?Marvetta Gibbons Therapist, sports, BSN ?Transitions of Care ?Unit 4E- RN Case Manager ?See Treatment Team for direct phone #  ? ? ?Patient Details  ?Name: Mario Green ?MRN: [id number] ?Date of Birth: Jun 08, 1955 ? ?Transition of Care (TOC) CM/SW Contact:  ?Dahlia Client, Romeo Rabon, RN ?Phone Number: ?08/03/2021, 12:55 PM ? ? ?Clinical Narrative:    ?Pt stable for transition home today, Transition of Care Department [facility]) has reviewed patient and no TOC needs have been identified at this time.  ? ?Final next level of care: Home/Self Care ?Barriers to Discharge: No Barriers Identified ? ? ?Patient Goals and CMS Choice ?  ?  ?Choice offered to / list presented to : NA ? ?Discharge Placement ?  ?           ?  ? Home ?  ?  ? ?Discharge Plan and Services ?  ?  ?Post Acute Care Choice: NA          ?DME Arranged: N/A ?DME Agency: NA ?  ?  ?  ?HH Arranged: NA ?[place] Agency: NA ?  ?  ?  ? ?Social Determinants of Health (SDOH) Interventions ?  ? ? ?Readmission Risk Interventions ? ?  08/03/2021  ? 12:54 PM  ?Readmission Risk Prevention Plan  ?Post Dischage Appt Complete  ?Medication Screening Complete  ?Transportation Screening Complete  ? ? ? ? ? ?

## 2021-08-03 NOTE — Care Management Important Message (Signed)
Important Message ? ?Patient Details  ?Name: Mario Green ?MRN: [id number] ?Date of Birth: 12-Dec-1955 ? ? ?Medicare Important Message Given:  Yes ? ? ? ? ?Shelda Altes ?08/03/2021, 7:38 AM ?

## 2021-08-03 NOTE — Progress Notes (Addendum)
[facility] [place] ? ?Subjective  - feels better, sore throat ? ? ?Objective ?(!) 174/79 ?66 ?98.2 ?F (36.8 ?C) (Oral) ?18 ?97% ? ?Intake/Output Summary (Last 24 hours) at 08/03/2021 0747 ?Last data filed at 08/02/2021 2028 ?Gross per 24 hour  ?Intake 340 ml  ?Output --  ?Net 340 ml  ? ? ?Right neck soft, ecchymosis, staples intact without active drainage and no hematoma reoccurrence. ?No tongue deviation and no facial droop ?Speech is clear ?Lungs non labored breathing, no SOB ?Palpable radial pule right UE, moving all exttremties ? ?Assessment/Planning: ?S/P right CEA for symptomatic ICA stenosis 07/31/21 followed by Hematoma development requiring evacuation with JP drain placement. ? ?No neurologic deficits with tongue or facial droop  ?He is on liquid /puree diet after swallow eval 06/04/21 Pending speech exam today. ?Over all he is improving without re current hematoma. ? ?Roxy Horseman ?08/03/2021 ?7:47 AM ?-- ? ?Laboratory ?Lab Results: ?Recent Labs  ?  07/31/21 ?1651 08/01/21 ?4599  ?WBC 9.0 8.8  ?HGB 11.6*  11.2* 11.0*  ?HCT 34.0*  34.7* 33.6*  ?PLT 175 166  ? ?BMET ?Recent Labs  ?  08/01/21 ?0357 08/02/21 ?0136  ?NA 138 143  ?K 4.0 4.4  ?CL 109 113*  ?CO2 19* 25  ?GLUCOSE 193* 133*  ?BUN 45* 37*  ?CREATININE 2.24* 2.31*  ?CALCIUM 8.0* 8.4*  ? ? ?COAG ?Lab Results  ?Component Value Date  ? INR 1.0 07/24/2021  ? ?No results found for: PTT ? ?I have independently interviewed and examined patient and agree with PA assessment and plan above.  His neck is much softer today he is speaking without any hoarseness states that his swallowing is improving.  He has been given diet instructions by speech.  He is okay for discharge today and will follow-up with me in a few weeks for staple removal. ? ?Jerrica Thorman C. Donzetta Matters, MD ?[facility] ?Office: [phone] ?Pager: [phone] ? ? ?

## 2021-08-03 NOTE — Progress Notes (Signed)
Pt d/c home from 4E with wife. D/c information & medication education provided. Tele and IV removed. ? ?Raelyn Number, RN ? ?

## 2021-08-04 DIAGNOSIS — E1169 Type 2 diabetes mellitus with other specified complication: Secondary | ICD-10-CM | POA: Diagnosis not present

## 2021-08-04 DIAGNOSIS — N184 Chronic kidney disease, stage 4 (severe): Secondary | ICD-10-CM | POA: Diagnosis not present

## 2021-08-04 DIAGNOSIS — I2510 Atherosclerotic heart disease of native coronary artery without angina pectoris: Secondary | ICD-10-CM | POA: Diagnosis not present

## 2021-08-04 DIAGNOSIS — F3341 Major depressive disorder, recurrent, in partial remission: Secondary | ICD-10-CM | POA: Diagnosis not present

## 2021-08-04 DIAGNOSIS — J309 Allergic rhinitis, unspecified: Secondary | ICD-10-CM | POA: Diagnosis not present

## 2021-08-04 DIAGNOSIS — I10 Essential (primary) hypertension: Secondary | ICD-10-CM | POA: Diagnosis not present

## 2021-08-04 DIAGNOSIS — I6521 Occlusion and stenosis of right carotid artery: Secondary | ICD-10-CM | POA: Diagnosis not present

## 2021-08-04 DIAGNOSIS — R7989 Other specified abnormal findings of blood chemistry: Secondary | ICD-10-CM | POA: Diagnosis not present

## 2021-08-05 DIAGNOSIS — N184 Chronic kidney disease, stage 4 (severe): Secondary | ICD-10-CM | POA: Diagnosis not present

## 2021-08-05 DIAGNOSIS — E1169 Type 2 diabetes mellitus with other specified complication: Secondary | ICD-10-CM | POA: Diagnosis not present

## 2021-08-08 NOTE — Discharge Summary (Signed)
[facility] Discharge Summary ? ? ?Patient ID:  ?Mario Green ?MRN: [id number] ?DOB/AGE: 1956-02-18 66 y.o. ? ?Admit date: 07/31/2021 ?Discharge date: 08/03/21 ?Date of Surgery: 07/31/2021 ?Surgeon: Surgeon(s): ?Waynetta Sandy, MD ? ?Admission Diagnosis: ?Carotid stenosis [I65.29] ?Carotid artery stenosis [I65.29] ? ?Discharge Diagnoses:  ?Carotid stenosis [I65.29] ?Carotid artery stenosis [I65.29] ? ?Secondary Diagnoses: ?Past Medical History:  ?Diagnosis Date  ? Allergic rhinitis   ? Anemia   ? Benign essential hypertension   ? CAD (coronary artery disease)   ? CKD (chronic kidney disease), symptom management only, stage 3 (moderate) (HCC)   ? Dyslipidemia 03/24/2015  ? ED (erectile dysfunction)   ? Hyperlipidemia   ? Morbid obesity due to excess calories ([place]) 03/24/2015  ? Obstructive sleep apnea 03/24/2015  ? patient denied having a sleep study done  ? Renal insufficiency   ? Second degree atrioventricular block, Mobitz (type) I 07/17/2021  ? 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 Formatting of this note might be different from the original. LIMA to LAD, SVG to diagonal 1, SVG to PDA  ? Tinnitus, bilateral   ? Type 2 diabetes mellitus without complication ([place]) 93/79/0240  ? ? ?Procedure(s): ?Hematoma Evacuation Right Post Carotid Endarterectomy ? ?Discharged Condition: stable ? ?HPI: 66 y/o male with asymptomatic right ICA stenosis > 80 %.  He was scheduled for right CEA with Dr. Donzetta Matters.   ? ? ?Hospital Course:  ?Mario Green is a 66 y.o. male is S/P  ?Procedure(s): ?Right carotid endarterectomy with bovine pericardial patch angioplasty ?Hematoma Evacuation Right Post Carotid Endarterectomy ?Right neck was soft with significant ecchymosis on post op day 1.  Swallow evaluation was conducted and he was placed on a liquid/puree diet.  He progressed fairly fast and was changed to a advance as tolerated diet slowly.   ? He had no tongue deviation of facial droop.  He was  ambulating independently.  The patient was discharged in stable condition post operative day 3 under the care of family. ?  ? ? ?Significant Diagnostic Studies: ?CBC ?Lab Results  ?Component Value Date  ? WBC 8.8 08/01/2021  ? HGB 11.0 (L) 08/01/2021  ? HCT 33.6 (L) 08/01/2021  ? MCV 93.3 08/01/2021  ? PLT 166 08/01/2021  ? ? ?BMET ?   ?Component Value Date/Time  ? NA 143 08/02/2021 0136  ? K 4.4 08/02/2021 0136  ? CL 113 (H) 08/02/2021 0136  ? CO2 25 08/02/2021 0136  ? GLUCOSE 133 (H) 08/02/2021 0136  ? BUN 37 (H) 08/02/2021 0136  ? CREATININE 2.31 (H) 08/02/2021 0136  ? CALCIUM 8.4 (L) 08/02/2021 0136  ? GFRNONAA 30 (L) 08/02/2021 0136  ? ?COAG ?Lab Results  ?Component Value Date  ? INR 1.0 07/24/2021  ? ? ? ?Disposition:  ?Discharge to :Home ?Discharge Instructions   ? ? Call MD for:  redness, tenderness, or signs of infection (pain, swelling, bleeding, redness, odor or green/yellow discharge around incision site)   Complete by: As directed ?  ? Call MD for:  severe or increased pain, loss or decreased feeling  in affected limb(s)   Complete by: As directed ?  ? Call MD for:  temperature >100.5   Complete by: As directed ?  ? Driving Restrictions   Complete by: As directed ?  ? No driving for 9.7-3 weeks  ? Resume previous diet   Complete by: As directed ?  ? ?  ? ?Allergies as of 08/03/2021   ?No Known Allergies ?  ? ?  ?  Medication List  ?  ? ?TAKE these medications   ? ?amLODipine 2.5 MG tablet ?Commonly known as: NORVASC ?Take 2.5 mg by mouth at bedtime. ?  ?aspirin 81 MG EC tablet ?Take 81 mg by mouth at bedtime. ?  ?augmented betamethasone dipropionate 0.05 % cream ?Commonly known as: DIPROLENE-AF ?Apply 1 application. topically daily as needed ([place]). ?  ?clopidogrel 75 MG tablet ?Commonly known as: PLAVIX ?Take 75 mg by mouth daily. ?  ?Evolocumab 140 MG/ML Soaj ?Inject 140 mg into the skin every 14 (fourteen) days. Inject 1 pen every 14 days subcutaneously ?What changed: additional instructions ?  ?Fish  Oil 1000 MG Caps ?Take 1,000 mg by mouth 2 (two) times daily. ?  ?insulin detemir 100 UNIT/ML FlexPen ?Commonly known as: LEVEMIR ?Inject 36 Units into the skin daily. ?  ?Insulin Pen Needle 29G X 5MM Misc ?1 Device by Does not apply route in the morning, at noon, in the evening, and at bedtime. ?  ?metoprolol tartrate 25 MG tablet ?Commonly known as: LOPRESSOR ?Take 1 tablet (25 mg total) by mouth daily. ?  ?NovoLOG FlexPen 100 UNIT/ML FlexPen ?Generic drug: insulin aspart ?Max daily 50 units ?What changed:  ?how much to take ?when to take this ?additional instructions ?  ?oxyCODONE-acetaminophen 5-325 MG tablet ?Commonly known as: PERCOCET/ROXICET ?Take 1-2 tablets by mouth every 4 (four) hours as needed for moderate pain. ?  ?tamsulosin 0.4 MG Caps capsule ?Commonly known as: FLOMAX ?Take 0.4 mg by mouth at bedtime. ?  ?Trulicity 4.5 IF/0.2DX Sopn ?Generic drug: Dulaglutide ?Inject 4.5 mg into the skin every Monday. ?  ? ?  ? ?Verbal and written Discharge instructions given to the patient. Wound care per Discharge AVS ? Follow-up Information   ? ? Waynetta Sandy, MD Follow up in 2 week(s).   ?Specialties: Vascular Surgery, Cardiology ?Why: Office will call you to arrange your appt (sent) ?Contact information: ?[address] ?[place] [address] ?[phone] ? ? ?  ?  ? ?  ?  ? ?  ? ?--- For [facility] Registry use --- ?Instructions: Press F2 to tab through selections.  Delete question if not applicable.  ? ?Modified Rankin score at D/C (0-6): Rankin Score=0 ? ?IV medication needed for:  ?1. Hypertension: No ?2. Hypotension: No ? ?Post-op Complications: Yes ? ?1. Post-op CVA or TIA: No ? ?If yes: Event classification (right eye, left eye, right cortical, left cortical, verterobasilar, other): incisional hematoma requiring evacuation same day. ? ?If yes: Timing of event (intra-op, <6 hrs post-op, >=6 hrs post-op, unknown): < 4 hours post op ? ?2. CN injury: No ? ?If yes: CN  injuried  ? ?3. Myocardial  infarction: No ? ?If yes: Dx by (EKG or clinical, Troponin):  ? ?4.  CHF: No ? ?5.  Dysrhythmia (new): No ? ?6. Wound infection: No ? ?7. Reperfusion symptoms: No ? ?8. Return to OR: Yes ? ?If yes: return to OR for (bleeding, neurologic, other CEA incision, other): hematoma right neck incision ? ?Discharge medications: ?Statin use:  No  for medical reason not indicated ?ASA use:  Yes ?Beta blocker use:  Yes ?ACE-Inhibitor use:  No  for medical reason not indicated ?P2Y12 Antagonist use: [ ]  None, [ x] Plavix, [ ]  Plasugrel, [ ]  Ticlopinine, [ ]  Ticagrelor, [ ]  Other, [ ]  No for medical reason, [ ]  Non-compliant, [ ]  Not-indicated ?Anti-coagulant use:  [ ] x None, [ ]  Warfarin, [ ]  Rivaroxaban, [ ]  Dabigatran, [ ]  Other, [ ]  No for  medical reason, [ ]  Non-compliant, [ ]  Not-indicated ?Signed: ?Roxy Horseman ?08/08/2021, 11:19 AM ? ?

## 2021-08-13 DIAGNOSIS — E1169 Type 2 diabetes mellitus with other specified complication: Secondary | ICD-10-CM | POA: Diagnosis not present

## 2021-08-20 DIAGNOSIS — E1169 Type 2 diabetes mellitus with other specified complication: Secondary | ICD-10-CM | POA: Diagnosis not present

## 2021-08-20 DIAGNOSIS — N184 Chronic kidney disease, stage 4 (severe): Secondary | ICD-10-CM | POA: Diagnosis not present

## 2021-08-20 DIAGNOSIS — I2510 Atherosclerotic heart disease of native coronary artery without angina pectoris: Secondary | ICD-10-CM | POA: Diagnosis not present

## 2021-08-20 DIAGNOSIS — I10 Essential (primary) hypertension: Secondary | ICD-10-CM | POA: Diagnosis not present

## 2021-08-20 DIAGNOSIS — J309 Allergic rhinitis, unspecified: Secondary | ICD-10-CM | POA: Diagnosis not present

## 2021-08-20 DIAGNOSIS — E785 Hyperlipidemia, unspecified: Secondary | ICD-10-CM | POA: Diagnosis not present

## 2021-08-20 DIAGNOSIS — R7989 Other specified abnormal findings of blood chemistry: Secondary | ICD-10-CM | POA: Diagnosis not present

## 2021-08-20 DIAGNOSIS — G4700 Insomnia, unspecified: Secondary | ICD-10-CM | POA: Diagnosis not present

## 2021-08-20 DIAGNOSIS — I6521 Occlusion and stenosis of right carotid artery: Secondary | ICD-10-CM | POA: Diagnosis not present

## 2021-08-21 DIAGNOSIS — R351 Nocturia: Secondary | ICD-10-CM | POA: Diagnosis not present

## 2021-08-21 DIAGNOSIS — E785 Hyperlipidemia, unspecified: Secondary | ICD-10-CM | POA: Diagnosis not present

## 2021-08-21 DIAGNOSIS — I5030 Unspecified diastolic (congestive) heart failure: Secondary | ICD-10-CM | POA: Diagnosis not present

## 2021-08-21 DIAGNOSIS — N1832 Chronic kidney disease, stage 3b: Secondary | ICD-10-CM | POA: Diagnosis not present

## 2021-08-21 DIAGNOSIS — I129 Hypertensive chronic kidney disease with stage 1 through stage 4 chronic kidney disease, or unspecified chronic kidney disease: Secondary | ICD-10-CM | POA: Diagnosis not present

## 2021-08-21 DIAGNOSIS — E875 Hyperkalemia: Secondary | ICD-10-CM | POA: Diagnosis not present

## 2021-08-21 DIAGNOSIS — E1122 Type 2 diabetes mellitus with diabetic chronic kidney disease: Secondary | ICD-10-CM | POA: Diagnosis not present

## 2021-08-29 ENCOUNTER — Encounter: Payer: Self-pay | Admitting: Vascular Surgery

## 2021-08-29 ENCOUNTER — Ambulatory Visit (INDEPENDENT_AMBULATORY_CARE_PROVIDER_SITE_OTHER): Payer: Medicare HMO | Admitting: Vascular Surgery

## 2021-08-29 VITALS — BP 102/72 | HR 70 | Temp 97.9°F | Resp 20 | Ht 72.0 in | Wt 295.0 lb

## 2021-08-29 DIAGNOSIS — I6521 Occlusion and stenosis of right carotid artery: Secondary | ICD-10-CM

## 2021-08-29 NOTE — Progress Notes (Signed)
     Subjective:     Patient ID: Mario Green, male   DOB: 09/03/1955, 66 y.o.   MRN: [id number]  HPI 68 male status post right carotid endarterectomy for asymptomatic disease complicated by hematoma.  Hematoma has now resolved.  He has no complaints related to today's visit.   Review of Systems Resolved hematoma    Objective:   Physical Exam Vitals:   08/29/21 0853  BP: 102/72  Pulse: 70  Resp: 20  Temp: 97.9 F (36.6 C)  SpO2: 96%   Awake alert oriented Right neck is soft, staples were removed today Moving all extremities without limitation    Assessment:     43-year male status post right carotid endarterectomy for asymptomatic disease now with resolved hematoma and staples removed today.    Plan:     Follow-up will be 1 year with repeat carotid duplex.  Shay Bartoli C. Donzetta Matters, MD [facility] Office: [phone] Pager: [phone]

## 2021-09-04 ENCOUNTER — Other Ambulatory Visit: Payer: Self-pay | Admitting: *Deleted

## 2021-09-04 DIAGNOSIS — I6521 Occlusion and stenosis of right carotid artery: Secondary | ICD-10-CM

## 2021-09-05 DIAGNOSIS — I10 Essential (primary) hypertension: Secondary | ICD-10-CM | POA: Diagnosis not present

## 2021-09-05 DIAGNOSIS — G4700 Insomnia, unspecified: Secondary | ICD-10-CM | POA: Diagnosis not present

## 2021-09-05 DIAGNOSIS — R7989 Other specified abnormal findings of blood chemistry: Secondary | ICD-10-CM | POA: Diagnosis not present

## 2021-09-05 DIAGNOSIS — J309 Allergic rhinitis, unspecified: Secondary | ICD-10-CM | POA: Diagnosis not present

## 2021-09-05 DIAGNOSIS — I2510 Atherosclerotic heart disease of native coronary artery without angina pectoris: Secondary | ICD-10-CM | POA: Diagnosis not present

## 2021-09-05 DIAGNOSIS — N184 Chronic kidney disease, stage 4 (severe): Secondary | ICD-10-CM | POA: Diagnosis not present

## 2021-09-05 DIAGNOSIS — I6521 Occlusion and stenosis of right carotid artery: Secondary | ICD-10-CM | POA: Diagnosis not present

## 2021-09-05 DIAGNOSIS — E1169 Type 2 diabetes mellitus with other specified complication: Secondary | ICD-10-CM | POA: Diagnosis not present

## 2021-10-02 ENCOUNTER — Encounter: Payer: Self-pay | Admitting: Internal Medicine

## 2021-10-02 ENCOUNTER — Ambulatory Visit: Payer: Medicare HMO | Admitting: Internal Medicine

## 2021-10-02 VITALS — BP 120/70 | HR 62 | Ht 72.0 in | Wt 298.0 lb

## 2021-10-02 DIAGNOSIS — E1159 Type 2 diabetes mellitus with other circulatory complications: Secondary | ICD-10-CM | POA: Diagnosis not present

## 2021-10-02 DIAGNOSIS — Z794 Long term (current) use of insulin: Secondary | ICD-10-CM | POA: Diagnosis not present

## 2021-10-02 LAB — POCT GLYCOSYLATED HEMOGLOBIN (HGB A1C): Hemoglobin A1C: 7.1 % — AB (ref 4.0–5.6)

## 2021-10-02 NOTE — Progress Notes (Signed)
Name: Mario Green  MRN/ DOB: [id number], [birth year]   Age/ Sex: 66 y.o., male    PCP: Cher Nakai, MD   Reason for Endocrinology Evaluation: Type 2 Diabetes Mellitus     Date of Initial Endocrinology Visit: 06/22/2021    PATIENT IDENTIFIER: [name] is a 66 y.o. male with a past medical history of CAD, HTN, T2DM, CKD and depression . The patient presented for initial endocrinology clinic visit on 06/22/2021 for consultative assistance with his diabetes management.    HPI: Mario Green was    Diagnosed with DM years ago  Prior Medications tried/Intolerance: Metformin Hemoglobin A1c peaking at 8.0% in 04/2021.   Nephrology : Dr. Birdie Riddle  Father with hx of DM    On his initial visit to our clinic his A1c was 7.6%  , he was on Levemir, Trulicity and Glimepiride. We switched Glimepiride to Novolog and continued basal insulin and Trulicity    SUBJECTIVE:   During last visit (06/22/2021): A1c 7.6% We stopped Glimepiride and started prandial insulin , continued levemir and Trulicity   Today (70/17/79):  Mario Green is here for a follow up on diabetes management.    He is S/P right cartoid endarterectomy  07/2021, slightly tender   Denies nausea, vomiting or diarrhea but has had constipation   HOME DIABETES REGIMEN: Novolog 10 units TIDQAC Trulicity 4.5 mg weekly - Pt assistance  Levemir 36 units daily - pt assistance     Statin: No, pt on Repatha  ACE-I/ARB: no Prior Diabetic Education: yes   CONTINUOUS GLUCOSE MONITORING RECORD INTERPRETATION  Unable to download     DIABETIC COMPLICATIONS: Microvascular complications:  CKD IV,  Denies: retinopathy, neuropathy  Last eye exam: Completed 2022  Macrovascular complications:  CAD, carotid artery disease Denies: PVD, CVA   PAST HISTORY: Past Medical History:  Past Medical History:  Diagnosis Date   Allergic rhinitis    Anemia    Benign essential hypertension    CAD (coronary artery disease)    CKD  (chronic kidney disease), symptom management only, stage 3 (moderate) ([place])    Dyslipidemia 03/24/2015   ED (erectile dysfunction)    Hyperlipidemia    Morbid obesity due to excess calories ([place]) 03/24/2015   Obstructive sleep apnea 03/24/2015   patient denied having a sleep study done   Renal insufficiency    Second degree atrioventricular block, Mobitz (type) I 07/17/2021   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 LIMA to LAD, SVG to diagonal 1, SVG to PDA   Tinnitus, bilateral    Type 2 diabetes mellitus without complication ([place]) 39/06/90   Past Surgical History:  Past Surgical History:  Procedure Laterality Date   ADENOIDECTOMY     CARDIAC CATHETERIZATION     CARDIAC SURGERY     Tripple bypass   COLONOSCOPY     ENDARTERECTOMY Right 07/31/2021   Procedure: Hematoma Evacuation Right Post Carotid Endarterectomy;  Surgeon: Waynetta Sandy, MD;  Location: [facility];  Service: Vascular;  Laterality: Right;   ENDARTERECTOMY Right 07/31/2021   Procedure: RIGHT ENDARTERECTOMY CAROTID WITH PATCH ANGIOPLASTY;  Surgeon: Waynetta Sandy, MD;  Location: [facility];  Service: Vascular;  Laterality: Right;    Social History:  reports that he has never smoked. He has never been exposed to tobacco smoke. He has quit using smokeless tobacco.  His smokeless tobacco use included chew. He reports that he does not currently use alcohol. He  reports that he does not use drugs. Family History:  Family History  Problem Relation Age of Onset   Dementia Mother    Diabetes Father    Heart attack Father      HOME MEDICATIONS: Allergies as of 10/02/2021   No Known Allergies      Medication List        Accurate as of October 02, 2021  7:24 AM. If you have any questions, ask your nurse or doctor.          amLODipine 2.5 MG tablet Commonly known as: NORVASC Take 2.5 mg by mouth at bedtime.   aspirin EC 81 MG  tablet Take 81 mg by mouth at bedtime.   clopidogrel 75 MG tablet Commonly known as: PLAVIX Take 75 mg by mouth daily.   Evolocumab 140 MG/ML Soaj Inject 140 mg into the skin every 14 (fourteen) days. Inject 1 pen every 14 days subcutaneously What changed: additional instructions   Fish Oil 1000 MG Caps Take 1,000 mg by mouth 2 (two) times daily.   insulin detemir 100 UNIT/ML FlexPen Commonly known as: LEVEMIR Inject 36 Units into the skin daily.   Insulin Pen Needle 29G X 5MM Misc 1 Device by Does not apply route in the morning, at noon, in the evening, and at bedtime.   metoprolol tartrate 25 MG tablet Commonly known as: LOPRESSOR Take 1 tablet (25 mg total) by mouth daily.   NovoLOG FlexPen 100 UNIT/ML FlexPen Generic drug: insulin aspart Max daily 50 units What changed:  how much to take when to take this additional instructions   tamsulosin 0.4 MG Caps capsule Commonly known as: FLOMAX Take 0.4 mg by mouth at bedtime.   Trulicity 4.5 MW/4.1LK Sopn Generic drug: Dulaglutide Inject 4.5 mg into the skin every Monday.         ALLERGIES: No Known Allergies   REVIEW OF SYSTEMS: A comprehensive ROS was conducted with the patient and is negative except as per HPI     OBJECTIVE:   VITAL SIGNS: BP 120/70 (BP Location: Left Arm, Patient Position: Sitting, Cuff Size: Large)   Pulse 62   Ht 6' (1.829 m)   Wt 298 lb (135.2 kg)   SpO2 94%   BMI 40.42 kg/m    PHYSICAL EXAM:  General: Pt appears well and is in NAD  Neck: General: Supple without adenopathy or carotid bruits. Thyroid: Thyroid size normal.  No goiter or nodules appreciated.  Lungs: Clear with good BS bilat with no rales, rhonchi, or wheezes  Heart: RRR   Abdomen: Normoactive bowel sounds, soft, nontender, without masses or organomegaly palpable  Extremities:  Lower extremities - No pretibial edema. No lesions.  Neuro: MS is good with appropriate affect, pt is alert and Ox3    DM Foot Exam  10/02/2021 The skin of the feet is intact without sores or ulcerations. The pedal pulses are 2+ on right and 2+ on left. The sensation is intact to a screening 5.07, 10 gram monofilament bilaterally    DATA REVIEWED:  04/30/2021 A1c 8.0% Tg 109 mg/dL HDL 31 LDL 59   BUN 69 Cr. 3.03 GFR 22  ASSESSMENT / PLAN / RECOMMENDATIONS:   1) Type 2 Diabetes Mellitus, Optimally ptimally controlled, With CKD IVand macrovascular  complications - Most recent A1c of 7.1%. Goal A1c < 7.0 %.     -A1c remains stable and at goal -Limited glycemic agents due to CKD III- IV -Patient states that his nephrologist recommends SGLT2 inhibitors , but  I explained to the patient with a GFR of 30.  SGLT2 inhibitors for glycemic control are not indicated -Patient is on patient assistance program for Levemir, [employer] and Trulicity -We will increase his basal as well as prandial dose of insulin -Annual prescription with updated insulin doses have been faxed to [place]: Increase NovoLog 12 units 3 times daily before every meal Increase Levemir 40 units once daily Correction factor: NovoLog (BG -956/21) Continue Trulicity 4.5 mg weekly  EDUCATION / INSTRUCTIONS: BG monitoring instructions: Patient is instructed to check his blood sugars 3 times a day, before meals. Call [facility] if: BG persistently < 70 I reviewed the Rule of 15 for the treatment of hypoglycemia in detail with the patient. Literature supplied.   2) Diabetic complications:  Eye: Does not have known diabetic retinopathy.  Neuro/ Feet: Does not have known diabetic peripheral neuropathy. Renal: Patient does  have known baseline CKD. He is  on an ACEI/ARB at present.    3)CAD/Dyslipidemia/carotid artery disease:   -Per cardiology -Patient on Repatha  Follow-up in 6 months    Signed electronically by: Mack Guise, MD  [facility]  [address] [place], [place] [address] Phone: [phone] FAX: [phone]   CC: Cher Nakai, [place] [address] Phone: [phone]  Fax: [phone]    Return to Endocrinology clinic as below: Future Appointments  Date Time Provider [place]  10/02/2021  9:50 AM Sevilla Murtagh, Melanie Crazier, MD [facility] None  11/12/2021 11:20 AM Richardo Priest, MD [facility] None

## 2021-10-03 DIAGNOSIS — E1169 Type 2 diabetes mellitus with other specified complication: Secondary | ICD-10-CM | POA: Diagnosis not present

## 2021-10-03 DIAGNOSIS — I10 Essential (primary) hypertension: Secondary | ICD-10-CM | POA: Diagnosis not present

## 2021-10-03 DIAGNOSIS — R7989 Other specified abnormal findings of blood chemistry: Secondary | ICD-10-CM | POA: Diagnosis not present

## 2021-10-03 DIAGNOSIS — J309 Allergic rhinitis, unspecified: Secondary | ICD-10-CM | POA: Diagnosis not present

## 2021-10-03 DIAGNOSIS — I6521 Occlusion and stenosis of right carotid artery: Secondary | ICD-10-CM | POA: Diagnosis not present

## 2021-10-03 DIAGNOSIS — N184 Chronic kidney disease, stage 4 (severe): Secondary | ICD-10-CM | POA: Diagnosis not present

## 2021-10-03 DIAGNOSIS — I2510 Atherosclerotic heart disease of native coronary artery without angina pectoris: Secondary | ICD-10-CM | POA: Diagnosis not present

## 2021-10-03 DIAGNOSIS — G4700 Insomnia, unspecified: Secondary | ICD-10-CM | POA: Diagnosis not present

## 2021-10-03 MED ORDER — INSULIN DETEMIR 100 UNIT/ML FLEXPEN: 40.0000 [IU] | PEN_INJECTOR | Freq: Every day | SUBCUTANEOUS | 3 refills | Status: AC

## 2021-10-03 MED ORDER — NOVOLOG FLEXPEN 100 UNIT/ML ~~LOC~~ SOPN: PEN_INJECTOR | SUBCUTANEOUS | 4 refills | Status: AC

## 2021-10-31 DIAGNOSIS — I6521 Occlusion and stenosis of right carotid artery: Secondary | ICD-10-CM | POA: Diagnosis not present

## 2021-10-31 DIAGNOSIS — I2510 Atherosclerotic heart disease of native coronary artery without angina pectoris: Secondary | ICD-10-CM | POA: Diagnosis not present

## 2021-10-31 DIAGNOSIS — E1169 Type 2 diabetes mellitus with other specified complication: Secondary | ICD-10-CM | POA: Diagnosis not present

## 2021-10-31 DIAGNOSIS — G4700 Insomnia, unspecified: Secondary | ICD-10-CM | POA: Diagnosis not present

## 2021-10-31 DIAGNOSIS — R7989 Other specified abnormal findings of blood chemistry: Secondary | ICD-10-CM | POA: Diagnosis not present

## 2021-10-31 DIAGNOSIS — J309 Allergic rhinitis, unspecified: Secondary | ICD-10-CM | POA: Diagnosis not present

## 2021-10-31 DIAGNOSIS — I10 Essential (primary) hypertension: Secondary | ICD-10-CM | POA: Diagnosis not present

## 2021-10-31 DIAGNOSIS — L600 Ingrowing nail: Secondary | ICD-10-CM | POA: Diagnosis not present

## 2021-11-02 DIAGNOSIS — E119 Type 2 diabetes mellitus without complications: Secondary | ICD-10-CM | POA: Diagnosis not present

## 2021-11-07 DIAGNOSIS — M79674 Pain in right toe(s): Secondary | ICD-10-CM | POA: Diagnosis not present

## 2021-11-07 DIAGNOSIS — L03039 Cellulitis of unspecified toe: Secondary | ICD-10-CM | POA: Diagnosis not present

## 2021-11-11 NOTE — Progress Notes (Signed)
Cardiology Office Note:    Date:  11/12/2021   ID:  [name], DOB 03-01-1956, MRN [id number]  PCP:  Cher Nakai, MD  Cardiologist:  Shirlee More, MD    Referring MD: Cher Nakai, MD    ASSESSMENT:    1. Coronary artery disease of native artery of native heart with stable angina pectoris ([place])   2. Mixed hyperlipidemia   3. CKD (chronic kidney disease), symptom management only, stage 3 (moderate) ([place])   4. Mobitz type 1 second degree atrioventricular block   5. Stenosis of right carotid artery    PLAN:    In order of problems listed above:  Stable CAD he is having no angina following CABG and on current medical therapy including clopidogrel beta-blocker and lipid-lowering Repatha he is statin intolerant. Continue current lipid-lowering treatment fish oil Repatha. Stable CKD stage III Stable he has had no symptomatic bradycardia Stable following elective carotid revascularization followed by vascular surgery   Next appointment: 1 year   Medication Adjustments/Labs and Tests Ordered: Current medicines are reviewed at length with the patient today.  Concerns regarding medicines are outlined above.  No orders of the defined types were placed in this encounter.  No orders of the defined types were placed in this encounter.   Chief Complaint  Patient presents with   Follow-up  For CAD following his carotid surgery  History of Present Illness:    Mario Green is a 66 y.o. male with a hx of CAD CABG in 2016 stage III CKD type 2 diabetes hyperlipidemia and carotid stenosis last seen 07/17/2021 preoperatively before right carotid endarterectomy for asymptomatic high-grade stenosis. An echocardiogram performed 08/01/2021 as an inpatient showing EF normal 55 to 60% with mild LVH elevated left ventricular filling pressure normal right ventricular size and function and no significant valvular abnormality.  1. Left ventricular ejection fraction, by estimation, is 55 to 60%.  The  left ventricle has normal function. The left ventricle has no regional  wall motion abnormalities. There is mild concentric left ventricular  hypertrophy. Left ventricular diastolic  parameters are consistent with Grade II diastolic dysfunction  (pseudonormalization). Elevated left ventricular end-diastolic pressure.   2. Right ventricular systolic function is normal. The right ventricular  size is normal.   3. Left atrial size was mildly dilated.   4. Right atrial size was mildly dilated.   5. The mitral valve is normal in structure. No evidence of mitral valve  regurgitation. No evidence of mitral stenosis.   6. The aortic valve is normal in structure. Aortic valve regurgitation is  not visualized. No aortic stenosis is present.   7. Aortic dilatation noted. There is mild dilatation of the aortic root,  measuring 37 mm. There is mild dilatation of the ascending aorta,  measuring 40 mm.  He had previously been seen by my partner Dr. Agustin Cree [facility] regional cardiology last 05/06/2016. He had coronary angiography performed 04/24/2015 showing patent grafts and no recommendation for further intervention.  I will cut-and-paste the record from care everywhere to the chart.. 1. Severe LAD/RCA lesions  2. Cir with mild disease  3. All grafts are widely patent (LIMA to LAD, SVG to RCA, SVG to Diag )  4. LVEDP 12, ventriculogram was not performed due to renal insuff. Normal by  recent non-invasive test.  Diagnostic Procedure Recommendations  Medical treatment.   Signatures   Electronically signed by Rozann Lesches, MD(Diagnostic Physician) on   04/24/2015 12:15   Angiographic findings   Cardiac Arteries and  Lesion Findings  LMCA: Normal.  LAD:    Lesion on Prox LAD: 95% stenosis.    Lesion on Mid LAD: 95% stenosis.  LCx:    Lesion on Prox CX: 20% stenosis.    Lesion on Mid CX: 40% stenosis.    Lesion on 1st Ob Marg: 30% stenosis.  RCA:    Lesion on Prox RCA: Ostial.100% stenosis.   Cardiac Grafts   -  There is a Vein graft that originates at the Aorta Left and attaches to      the 1st Diag.patent   -  There is a Vein graft that originates at the Aorta Right and attaches to      the Mid RCA.patnet   -  There is a LIMA graft that originates at the LIMA and attaches to the Mid      LAD.patent  Compliance with diet, lifestyle and medications: Yes  He is recovered from surgery and has not taken single antiplatelet drug clopidogrel he still has some mild bruising.  He has had no mucosal bleeding. He tolerates his PCSK9 inhibitor he is statin intolerant He is not having edema shortness of breath orthopnea chest pain palpitation or syncope Most recent lipid profile cholesterol 169 LDL 109 A1c 7.4 hemoglobin 11 creatinine 2.35 Past Medical History:  Diagnosis Date   Allergic rhinitis    Anemia    Benign essential hypertension    CAD (coronary artery disease)    CKD (chronic kidney disease), symptom management only, stage 3 (moderate) (H[facility])    Dyslipidemia 03/24/2015   ED (erectile dysfunction)    Hyperlipidemia    Morbid obesity due to excess calories ([place]) 03/24/2015   Obstructive sleep apnea 03/24/2015   patient denied having a sleep study done   Renal insufficiency    Second degree atrioventricular block, Mobitz (type) I 07/17/2021   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 LIMA to LAD, SVG to diagonal 1, SVG to PDA   Tinnitus, bilateral    Type 2 diabetes mellitus without complication ([place]) 20/25/4270    Past Surgical History:  Procedure Laterality Date   ADENOIDECTOMY     CARDIAC CATHETERIZATION     CARDIAC SURGERY     Tripple bypass   COLONOSCOPY     ENDARTERECTOMY Right 07/31/2021   Procedure: Hematoma Evacuation Right Post Carotid Endarterectomy;  Surgeon: Waynetta Sandy, MD;  Location: [place];  Service: Vascular;  Laterality: Right;   ENDARTERECTOMY Right 07/31/2021    Procedure: RIGHT ENDARTERECTOMY CAROTID WITH PATCH ANGIOPLASTY;  Surgeon: Waynetta Sandy, MD;  [facility];  Service: Vascular;  Laterality: Right;    Current Medications: Current Meds  Medication Sig   amLODipine (NORVASC) 2.5 MG tablet Take 2.5 mg by mouth at bedtime.   amoxicillin-clavulanate (AUGMENTIN) 875-125 MG tablet Take 1 tablet by mouth 2 (two) times daily.   aspirin 81 MG EC tablet Take 81 mg by mouth at bedtime.   augmented betamethasone dipropionate (DIPROLENE-AF) 0.05 % cream Apply 1 Application topically 2 (two) times daily as needed for rash.   clopidogrel (PLAVIX) 75 MG tablet Take 75 mg by mouth daily.   Dulaglutide (TRULICITY) 4.5 WC/3.7SE SOPN Inject 4.5 mg into the skin every Monday.   Evolocumab 140 MG/ML SOAJ Inject 140 mg into the skin every 14 (fourteen) days. Inject 1 pen every 14 days subcutaneously   insulin aspart (NOVOLOG FLEXPEN) 100 UNIT/ML FlexPen Max daily 50 units   insulin  detemir (LEVEMIR) 100 UNIT/ML FlexPen Inject 40 Units into the skin daily.   Insulin Pen Needle 29G X 5MM MISC 1 Device by Does not apply route in the morning, at noon, in the evening, and at bedtime.   metoprolol tartrate (LOPRESSOR) 25 MG tablet Take 1 tablet (25 mg total) by mouth daily.   Omega-3 Fatty Acids (FISH OIL) 1000 MG CAPS Take 1,000 mg by mouth 2 (two) times daily.   tamsulosin (FLOMAX) 0.4 MG CAPS capsule Take 0.4 mg by mouth at bedtime.   zolpidem (AMBIEN) 10 MG tablet Take 10 mg by mouth at bedtime as needed for sleep.     Allergies:   Patient has no known allergies.   Social History   Socioeconomic History   Marital status: Single    Spouse name: Not on file   Number of children: Not on file   Years of education: Not on file   Highest education level: Not on file  Occupational History   Not on file  Tobacco Use   Smoking status: Never    Passive exposure: Never   Smokeless tobacco: Former    Types: Nurse, children's Use: Never  used  Substance and Sexual Activity   Alcohol use: Not Currently    Comment: occasional   Drug use: Never   Sexual activity: Not on file  Other Topics Concern   Not on file  Social History Narrative   Not on file   Social Determinants of Health   Financial Resource Strain: Not on file  Food Insecurity: Not on file  Transportation Needs: Not on file  Physical Activity: Not on file  Stress: Not on file  Social Connections: Not on file     Family History: The patient's family history includes Dementia in his mother; Diabetes in his father; Heart attack in his father. ROS:   Please see the history of present illness.    All other systems reviewed and are negative.  EKGs/Labs/Other Studies Reviewed:    The following studies were reviewed today:  Recent Labs: 07/24/2021: ALT 26 07/31/2021: Magnesium 1.8 08/01/2021: Hemoglobin 11.0; Platelets 166 08/02/2021: BUN 37; Creatinine, Ser 2.31; Potassium 4.4; Sodium 143  Recent Lipid Panel    Component Value Date/Time   CHOL 120 08/02/2021 0136   TRIG 136 08/02/2021 0136   HDL 25 (L) 08/02/2021 0136   CHOLHDL 4.8 08/02/2021 0136   VLDL 27 08/02/2021 0136   LDLCALC 68 08/02/2021 0136    Physical Exam:    VS:  BP 114/70 (BP Location: Left Arm, Patient Position: Sitting, Cuff Size: Normal)   Pulse (!) 54   Ht 6' (1.829 m)   Wt 295 lb (133.8 kg)   SpO2 95%   BMI 40.01 kg/m     Wt Readings from Last 3 Encounters:  11/12/21 295 lb (133.8 kg)  10/02/21 298 lb (135.2 kg)  08/29/21 295 lb (133.8 kg)     GEN:  Well nourished, well developed in no acute distress HEENT: Normal NECK: No JVD; No carotid bruits LYMPHATICS: No lymphadenopathy CARDIAC: RRR, no murmurs, rubs, gallops RESPIRATORY:  Clear to auscultation without rales, wheezing or rhonchi  ABDOMEN: Soft, non-tender, non-distended MUSCULOSKELETAL:  No edema; No deformity  SKIN: Warm and dry NEUROLOGIC:  Alert and oriented x 3 PSYCHIATRIC:  Normal affect     Signed, Shirlee More, MD  11/12/2021 12:04 PM    [place]

## 2021-11-12 ENCOUNTER — Encounter: Payer: Self-pay | Admitting: Cardiology

## 2021-11-12 ENCOUNTER — Ambulatory Visit: Payer: Medicare HMO | Admitting: Cardiology

## 2021-11-12 VITALS — BP 114/70 | HR 54 | Ht 72.0 in | Wt 295.0 lb

## 2021-11-12 DIAGNOSIS — I6521 Occlusion and stenosis of right carotid artery: Secondary | ICD-10-CM

## 2021-11-12 DIAGNOSIS — I25118 Atherosclerotic heart disease of native coronary artery with other forms of angina pectoris: Secondary | ICD-10-CM

## 2021-11-12 DIAGNOSIS — E782 Mixed hyperlipidemia: Secondary | ICD-10-CM | POA: Diagnosis not present

## 2021-11-12 DIAGNOSIS — N183 Chronic kidney disease, stage 3 unspecified: Secondary | ICD-10-CM | POA: Diagnosis not present

## 2021-11-12 DIAGNOSIS — I441 Atrioventricular block, second degree: Secondary | ICD-10-CM | POA: Diagnosis not present

## 2021-11-12 MED ORDER — CLOPIDOGREL BISULFATE 75 MG PO TABS: 75.0000 mg | ORAL_TABLET | Freq: Every day | ORAL | 3 refills | Status: DC

## 2021-11-12 NOTE — Patient Instructions (Signed)

## 2021-11-21 DIAGNOSIS — L600 Ingrowing nail: Secondary | ICD-10-CM | POA: Diagnosis not present

## 2021-11-28 DIAGNOSIS — G4700 Insomnia, unspecified: Secondary | ICD-10-CM | POA: Diagnosis not present

## 2021-11-28 DIAGNOSIS — I10 Essential (primary) hypertension: Secondary | ICD-10-CM | POA: Diagnosis not present

## 2021-11-28 DIAGNOSIS — E785 Hyperlipidemia, unspecified: Secondary | ICD-10-CM | POA: Diagnosis not present

## 2021-11-28 DIAGNOSIS — D649 Anemia, unspecified: Secondary | ICD-10-CM | POA: Diagnosis not present

## 2021-11-28 DIAGNOSIS — M545 Low back pain, unspecified: Secondary | ICD-10-CM | POA: Diagnosis not present

## 2021-11-28 DIAGNOSIS — R5382 Chronic fatigue, unspecified: Secondary | ICD-10-CM | POA: Diagnosis not present

## 2021-11-28 DIAGNOSIS — N529 Male erectile dysfunction, unspecified: Secondary | ICD-10-CM | POA: Diagnosis not present

## 2021-11-28 DIAGNOSIS — E1169 Type 2 diabetes mellitus with other specified complication: Secondary | ICD-10-CM | POA: Diagnosis not present

## 2021-12-06 DIAGNOSIS — E785 Hyperlipidemia, unspecified: Secondary | ICD-10-CM | POA: Diagnosis not present

## 2021-12-06 DIAGNOSIS — N529 Male erectile dysfunction, unspecified: Secondary | ICD-10-CM | POA: Diagnosis not present

## 2021-12-06 DIAGNOSIS — I10 Essential (primary) hypertension: Secondary | ICD-10-CM | POA: Diagnosis not present

## 2021-12-06 DIAGNOSIS — D649 Anemia, unspecified: Secondary | ICD-10-CM | POA: Diagnosis not present

## 2021-12-06 DIAGNOSIS — E1169 Type 2 diabetes mellitus with other specified complication: Secondary | ICD-10-CM | POA: Diagnosis not present

## 2021-12-06 DIAGNOSIS — F3341 Major depressive disorder, recurrent, in partial remission: Secondary | ICD-10-CM | POA: Diagnosis not present

## 2021-12-06 DIAGNOSIS — J309 Allergic rhinitis, unspecified: Secondary | ICD-10-CM | POA: Diagnosis not present

## 2021-12-06 DIAGNOSIS — L239 Allergic contact dermatitis, unspecified cause: Secondary | ICD-10-CM | POA: Diagnosis not present

## 2021-12-06 DIAGNOSIS — G4700 Insomnia, unspecified: Secondary | ICD-10-CM | POA: Diagnosis not present

## 2021-12-14 DIAGNOSIS — M545 Low back pain, unspecified: Secondary | ICD-10-CM | POA: Diagnosis not present

## 2021-12-14 DIAGNOSIS — L239 Allergic contact dermatitis, unspecified cause: Secondary | ICD-10-CM | POA: Diagnosis not present

## 2021-12-14 DIAGNOSIS — E785 Hyperlipidemia, unspecified: Secondary | ICD-10-CM | POA: Diagnosis not present

## 2021-12-14 DIAGNOSIS — D649 Anemia, unspecified: Secondary | ICD-10-CM | POA: Diagnosis not present

## 2021-12-14 DIAGNOSIS — I10 Essential (primary) hypertension: Secondary | ICD-10-CM | POA: Diagnosis not present

## 2021-12-14 DIAGNOSIS — N529 Male erectile dysfunction, unspecified: Secondary | ICD-10-CM | POA: Diagnosis not present

## 2021-12-14 DIAGNOSIS — E1169 Type 2 diabetes mellitus with other specified complication: Secondary | ICD-10-CM | POA: Diagnosis not present

## 2021-12-14 DIAGNOSIS — G4700 Insomnia, unspecified: Secondary | ICD-10-CM | POA: Diagnosis not present

## 2021-12-21 DIAGNOSIS — Z23 Encounter for immunization: Secondary | ICD-10-CM | POA: Diagnosis not present

## 2021-12-21 DIAGNOSIS — I129 Hypertensive chronic kidney disease with stage 1 through stage 4 chronic kidney disease, or unspecified chronic kidney disease: Secondary | ICD-10-CM | POA: Diagnosis not present

## 2021-12-21 DIAGNOSIS — R351 Nocturia: Secondary | ICD-10-CM | POA: Diagnosis not present

## 2021-12-21 DIAGNOSIS — E1122 Type 2 diabetes mellitus with diabetic chronic kidney disease: Secondary | ICD-10-CM | POA: Diagnosis not present

## 2021-12-21 DIAGNOSIS — N1832 Chronic kidney disease, stage 3b: Secondary | ICD-10-CM | POA: Diagnosis not present

## 2021-12-21 DIAGNOSIS — E875 Hyperkalemia: Secondary | ICD-10-CM | POA: Diagnosis not present

## 2021-12-21 DIAGNOSIS — I5030 Unspecified diastolic (congestive) heart failure: Secondary | ICD-10-CM | POA: Diagnosis not present

## 2021-12-21 DIAGNOSIS — E785 Hyperlipidemia, unspecified: Secondary | ICD-10-CM | POA: Diagnosis not present

## 2021-12-24 DIAGNOSIS — I10 Essential (primary) hypertension: Secondary | ICD-10-CM | POA: Diagnosis not present

## 2021-12-24 DIAGNOSIS — L239 Allergic contact dermatitis, unspecified cause: Secondary | ICD-10-CM | POA: Diagnosis not present

## 2021-12-24 DIAGNOSIS — E1169 Type 2 diabetes mellitus with other specified complication: Secondary | ICD-10-CM | POA: Diagnosis not present

## 2021-12-24 DIAGNOSIS — N529 Male erectile dysfunction, unspecified: Secondary | ICD-10-CM | POA: Diagnosis not present

## 2021-12-24 DIAGNOSIS — G4700 Insomnia, unspecified: Secondary | ICD-10-CM | POA: Diagnosis not present

## 2021-12-24 DIAGNOSIS — M25562 Pain in left knee: Secondary | ICD-10-CM | POA: Diagnosis not present

## 2021-12-24 DIAGNOSIS — D649 Anemia, unspecified: Secondary | ICD-10-CM | POA: Diagnosis not present

## 2021-12-24 DIAGNOSIS — M545 Low back pain, unspecified: Secondary | ICD-10-CM | POA: Diagnosis not present

## 2021-12-28 DIAGNOSIS — E785 Hyperlipidemia, unspecified: Secondary | ICD-10-CM | POA: Diagnosis not present

## 2021-12-28 DIAGNOSIS — D649 Anemia, unspecified: Secondary | ICD-10-CM | POA: Diagnosis not present

## 2021-12-28 DIAGNOSIS — N529 Male erectile dysfunction, unspecified: Secondary | ICD-10-CM | POA: Diagnosis not present

## 2021-12-28 DIAGNOSIS — L239 Allergic contact dermatitis, unspecified cause: Secondary | ICD-10-CM | POA: Diagnosis not present

## 2021-12-28 DIAGNOSIS — G4700 Insomnia, unspecified: Secondary | ICD-10-CM | POA: Diagnosis not present

## 2021-12-28 DIAGNOSIS — I10 Essential (primary) hypertension: Secondary | ICD-10-CM | POA: Diagnosis not present

## 2021-12-28 DIAGNOSIS — E1169 Type 2 diabetes mellitus with other specified complication: Secondary | ICD-10-CM | POA: Diagnosis not present

## 2021-12-28 DIAGNOSIS — M545 Low back pain, unspecified: Secondary | ICD-10-CM | POA: Diagnosis not present

## 2022-01-08 NOTE — Progress Notes (Unsigned)
Name: Mario Green  MRN/ DOB: [id number], 01/25/1956   Age/ Sex: 66 y.o., male    PCP: Cher Nakai, MD   Reason for Endocrinology Evaluation: Type 2 Diabetes Mellitus     Date of Initial Endocrinology Visit: 06/22/2021    PATIENT IDENTIFIER: [name] is a 66 y.o. male with a past medical history of CAD, HTN, T2DM, CKD and depression . The patient presented for initial endocrinology clinic visit on 06/22/2021 for consultative assistance with his diabetes management.    HPI: Mario Green was    Diagnosed with DM years ago  Prior Medications tried/Intolerance: Metformin Hemoglobin A1c peaking at 8.0% in 04/2021.   Nephrology : Dr. Birdie Riddle  Father with hx of DM    On his initial visit to our clinic his A1c was 7.6%  , he was on Levemir, Trulicity and Glimepiride. We switched Glimepiride to Novolog and continued basal insulin and Trulicity   He was started on Farxiga in August 2023 by PCP    SUBJECTIVE:   During last visit (10/02/2021): A1c 7.1%     Today (01/09/22):  Mario Green is here for a follow up on diabetes management.    He is S/P right cartoid endarterectomy  07/2021, was last see by cardiology 11/12/2021 for stable angina  Denies nausea, vomiting or diarrhea  He follows with nephrology    HOME DIABETES REGIMEN: Farxiga 5 mg daily  Novolog 12 units TIDQAC Trulicity 4.5 mg weekly - Pt assistance  Levemir 40 units daily - pt assistance  Correction factor: NovoLog (BG -130/30)   Statin: No, pt on Repatha  ACE-I/ARB: no Prior Diabetic Education: yes   CONTINUOUS GLUCOSE MONITORING RECORD INTERPRETATION    Dates of Recording: 9/21-10/07/2021  Sensor description:freestyle libre   Results statistics:   CGM use % of time 88  Average and SD 154/25.3  Time in range        %  % Time Above 180   % Time above 250   % Time Below target    Glycemic patterns summary:   Hyperglycemic episodes  postprandial  Hypoglycemic episodes occurred   rare  Overnight periods: stable      DIABETIC COMPLICATIONS: Microvascular complications:  CKD IV,  Denies: retinopathy, neuropathy  Last eye exam: Completed 2022  Macrovascular complications:  CAD, carotid artery disease Denies: PVD, CVA   PAST HISTORY: Past Medical History:  Past Medical History:  Diagnosis Date   Allergic rhinitis    Anemia    Benign essential hypertension    CAD (coronary artery disease)    CKD (chronic kidney disease), symptom management only, stage 3 (moderate) (HCC)    Dyslipidemia 03/24/2015   ED (erectile dysfunction)    Hyperlipidemia    Morbid obesity due to excess calories ([place]) 03/24/2015   Obstructive sleep apnea 03/24/2015   patient denied having a sleep study done   Renal insufficiency    Second degree atrioventricular block, Mobitz (type) I 07/17/2021   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 LIMA to LAD, SVG to diagonal 1, SVG to PDA   Tinnitus, bilateral    Type 2 diabetes mellitus without complication ([place]) 02/72/5366   Past Surgical History:  Past Surgical History:  Procedure Laterality Date   ADENOIDECTOMY     CARDIAC CATHETERIZATION     CARDIAC SURGERY     Tripple bypass   COLONOSCOPY     ENDARTERECTOMY Right 07/31/2021   Procedure:  Hematoma Evacuation Right Post Carotid Endarterectomy;  Surgeon: Waynetta Sandy, MD;  Location: [place];  Service: Vascular;  Laterality: Right;   ENDARTERECTOMY Right 07/31/2021   Procedure: RIGHT ENDARTERECTOMY CAROTID WITH PATCH ANGIOPLASTY;  Surgeon: Waynetta Sandy, MD;  Location: [place];  Service: Vascular;  Laterality: Right;    Social History:  reports that he has never smoked. He has never been exposed to tobacco smoke. He has quit using smokeless tobacco.  His smokeless tobacco use included chew. He reports that he does not currently use alcohol. He reports that he does not use drugs. Family  History:  Family History  Problem Relation Age of Onset   Dementia Mother    Diabetes Father    Heart attack Father      HOME MEDICATIONS: Allergies as of 01/09/2022   No Known Allergies      Medication List        Accurate as of January 09, 2022 10:22 AM. If you have any questions, ask your nurse or doctor.          STOP taking these medications    amoxicillin-clavulanate 875-125 MG tablet Commonly known as: AUGMENTIN Stopped by: Dorita Sciara, MD       TAKE these medications    amLODipine 2.5 MG tablet Commonly known as: NORVASC Take 2.5 mg by mouth at bedtime.   augmented betamethasone dipropionate 0.05 % cream Commonly known as: DIPROLENE-AF Apply 1 Application topically 2 (two) times daily as needed for rash.   clopidogrel 75 MG tablet Commonly known as: PLAVIX Take 1 tablet (75 mg total) by mouth daily.   Evolocumab 140 MG/ML Soaj Inject 140 mg into the skin every 14 (fourteen) days. Inject 1 pen every 14 days subcutaneously   Farxiga 5 MG Tabs tablet Generic drug: dapagliflozin propanediol Take 1 tablet by mouth daily.   Fish Oil 1000 MG Caps Take 1,000 mg by mouth 2 (two) times daily.   insulin detemir 100 UNIT/ML FlexPen Commonly known as: LEVEMIR Inject 40 Units into the skin daily.   Insulin Pen Needle 29G X 5MM Misc 1 Device by Does not apply route in the morning, at noon, in the evening, and at bedtime.   metoprolol tartrate 25 MG tablet Commonly known as: LOPRESSOR Take 1 tablet (25 mg total) by mouth daily.   NovoLOG FlexPen 100 UNIT/ML FlexPen Generic drug: insulin aspart Max daily 50 units   tamsulosin 0.4 MG Caps capsule Commonly known as: FLOMAX Take 0.4 mg by mouth at bedtime.   Trulicity 4.5 XK/4.8JE Sopn Generic drug: Dulaglutide Inject 4.5 mg into the skin every Monday.   zolpidem 10 MG tablet Commonly known as: AMBIEN Take 10 mg by mouth at bedtime as needed for sleep.         ALLERGIES: No Known  Allergies   REVIEW OF SYSTEMS: A comprehensive ROS was conducted with the patient and is negative except as per HPI     OBJECTIVE:   VITAL SIGNS: BP 134/82 (BP Location: Left Arm, Patient Position: Sitting, Cuff Size: Large)   Pulse (!) 54   Ht 6' (1.829 m)   Wt 293 lb (132.9 kg)   SpO2 96%   BMI 39.74 kg/m    PHYSICAL EXAM:  General: Pt appears well and is in NAD  Neck: General: Supple without adenopathy or carotid bruits. Thyroid: Thyroid size normal.  No goiter or nodules appreciated.  Lungs: Clear with good BS bilat with no rales, rhonchi, or wheezes  Heart: RRR  Neuro: MS is good with appropriate affect, pt is alert and Ox3    DM Foot Exam 10/02/2021 The skin of the feet is intact without sores or ulcerations. The pedal pulses are 2+ on right and 2+ on left. The sensation is intact to a screening 5.07, 10 gram monofilament bilaterally    DATA REVIEWED:  Latest Reference Range & Units 08/02/21 01:36  Sodium 135 - 145 mmol/L 143  Potassium 3.5 - 5.1 mmol/L 4.4  Chloride 98 - 111 mmol/L 113 (H)  CO2 22 - 32 mmol/L 25  Glucose 70 - 99 mg/dL 133 (H)  BUN 8 - 23 mg/dL 37 (H)  Creatinine 0.61 - 1.24 mg/dL 2.31 (H)  Calcium 8.9 - 10.3 mg/dL 8.4 (L)  Anion gap 5 - 15  5  GFR, Estimated >60 mL/min 30 (L)  Total CHOL/HDL Ratio RATIO 4.8  Cholesterol 0 - 200 mg/dL 120  HDL Cholesterol >40 mg/dL 25 (L)  LDL (calc) 0 - 99 mg/dL 68  Triglycerides <150 mg/dL 136  VLDL 0 - 40 mg/dL 27    ASSESSMENT / PLAN / RECOMMENDATIONS:   1) Type 2 Diabetes Mellitus, Optimally ptimally controlled, With CKD IVand macrovascular  complications - Most recent A1c of 7.7%. Goal A1c < 7.0 %.     -A1c remains above goal and it will be difficult to optimize his glucose given his fear of hypoglycemia, there may also be some OCD involved as he has been noted to check glucose with CGM twice an hour for 20-22 hours a day including overnight    - He admits that his finger sticks are 10 point  higher than the freestyle libre but he is still scared and check glucose hourly through the night  - He has been noted with hypoglycemia episodes postprandial but this is most likely due to taking novolog post meal rather than pre-meal and using correction scale for post- meal BG. - I will reduce his prandial dose of insulin as below and he was advised to use the correction scale for the PRE_MEAL glucose only not post meal to prevent hypoglycemia  -Limited glycemic agents due to CKD III- IV -Patient states that his nephrologist recommends SGLT2 inhibitors , but I explained to the patient with a GFR of 30.  SGLT2 inhibitors for glycemic control are not indicated -Patient is on patient assistance program for Levemir, Novo Nordisk and Trulicity -  MEDICATIONS: Decrease NovoLog 10 units 3 times daily before every meal Continue  Levemir 40 units once daily Correction factor: NovoLog (BG -295/18) Continue Trulicity 4.5 mg weekly  EDUCATION / INSTRUCTIONS: BG monitoring instructions: Patient is instructed to check his blood sugars 3 times a day, before meals. Call [place] [facility] clinic if: BG persistently < 70 I reviewed the Rule of 15 for the treatment of hypoglycemia in detail with the patient. Literature supplied.   2) Diabetic complications:  Eye: Does not have known diabetic retinopathy.  Neuro/ Feet: Does not have known diabetic peripheral neuropathy. Renal: Patient does  have known baseline CKD. He is  on an ACEI/ARB at present.    3)CAD/Dyslipidemia/carotid artery disease:   -Per cardiology -Patient on Repatha    Follow-up in 6 months  I spent 25 minutes preparing to see the patient by review of recent labs, imaging and procedures, obtaining and reviewing separately obtained history, communicating with the patient, ordering medications, tests or procedures, and documenting clinical information in the EHR including the differential Dx, treatment, and any further evaluation  and other management  Signed electronically by: Mack Guise, MD  [facility] End[facility]  [facility] [address] [place], [place] [address] Phone: [phone] FAX: [phone]   CC: Cher Nakai, MD [place] [address] Phone: [phone]  Fax: [phone]    Return to Endocrinology clinic as below: No future appointments.

## 2022-01-09 ENCOUNTER — Ambulatory Visit: Payer: Medicare HMO | Admitting: Internal Medicine

## 2022-01-09 ENCOUNTER — Encounter: Payer: Self-pay | Admitting: Internal Medicine

## 2022-01-09 VITALS — BP 134/82 | HR 54 | Ht 72.0 in | Wt 293.0 lb

## 2022-01-09 DIAGNOSIS — N184 Chronic kidney disease, stage 4 (severe): Secondary | ICD-10-CM

## 2022-01-09 DIAGNOSIS — E1122 Type 2 diabetes mellitus with diabetic chronic kidney disease: Secondary | ICD-10-CM

## 2022-01-09 DIAGNOSIS — Z794 Long term (current) use of insulin: Secondary | ICD-10-CM

## 2022-01-09 DIAGNOSIS — E1159 Type 2 diabetes mellitus with other circulatory complications: Secondary | ICD-10-CM | POA: Diagnosis not present

## 2022-01-09 LAB — POCT GLYCOSYLATED HEMOGLOBIN (HGB A1C): Hemoglobin A1C: 7.7 % — AB (ref 4.0–5.6)

## 2022-01-09 NOTE — Patient Instructions (Signed)
-  Continue Trulicity 4.5 mg weekly -Decrease  Novolog 10 units three times daily with each meal  -Continue  Levemir 40 units ONCE daily  -Novolog correctional insulin: ADD extra units on insulin to your meal-time Novolog dose if your blood sugars are higher than 160. Use the scale below to help guide you:   Blood sugar before meal Number of units to inject  Less than 160 0 unit  161 -  190 1 units  191 -  220 2 units  221 -  250 3 units  251 -  280 4 units  281 -  310 5 units  311 -  340 6 units  341 -  370 7 units  371 -  400 8 units     HOW TO TREAT LOW BLOOD SUGARS (Blood sugar LESS THAN 70 MG/DL) Please follow the RULE OF 15 for the treatment of hypoglycemia treatment (when your (blood sugars are less than 70 mg/dL)   STEP 1: Take 15 grams of carbohydrates when your blood sugar is low, which includes:  3-4 GLUCOSE TABS  OR 3-4 OZ OF JUICE OR REGULAR SODA OR ONE TUBE OF GLUCOSE GEL    STEP 2: RECHECK blood sugar in 15 MINUTES STEP 3: If your blood sugar is still low at the 15 minute recheck --> then, go back to STEP 1 and treat AGAIN with another 15 grams of carbohydrates.

## 2022-01-21 DIAGNOSIS — N1832 Chronic kidney disease, stage 3b: Secondary | ICD-10-CM | POA: Diagnosis not present

## 2022-01-21 DIAGNOSIS — E119 Type 2 diabetes mellitus without complications: Secondary | ICD-10-CM | POA: Diagnosis not present

## 2022-02-14 IMAGING — US US RENAL
1 series · 14 of 25 positions shown · non-contrast
Comparison: None.

CLINICAL DATA: Stage 3b CKD, AK I.

EXAM:
RENAL / URINARY TRACT ULTRASOUND COMPLETE

[Series 1: us renal · 0.34mm/px · 14 of 34 slices shown]
[im 1/34]
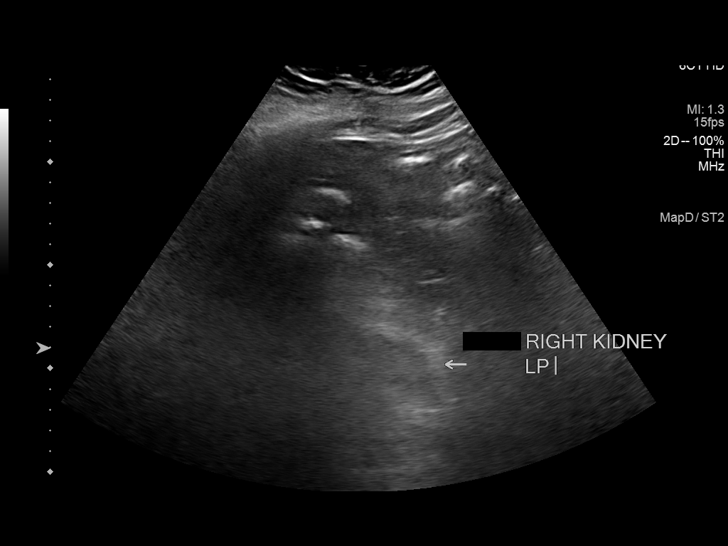
[im 3/34]
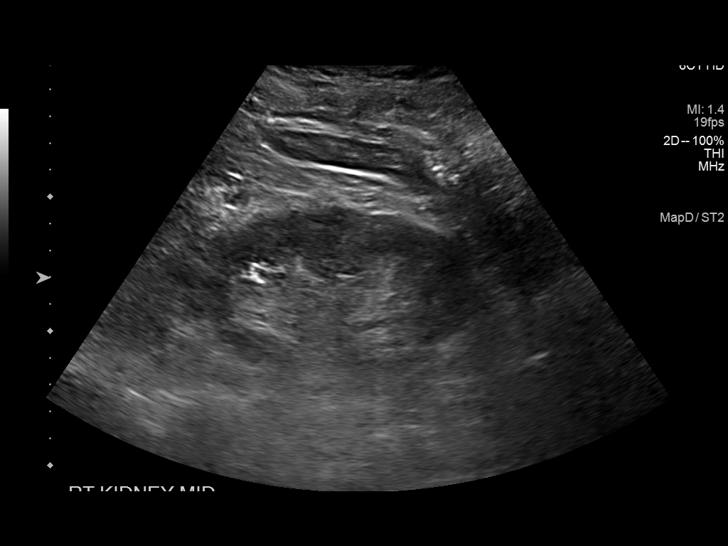
[im 6/34]
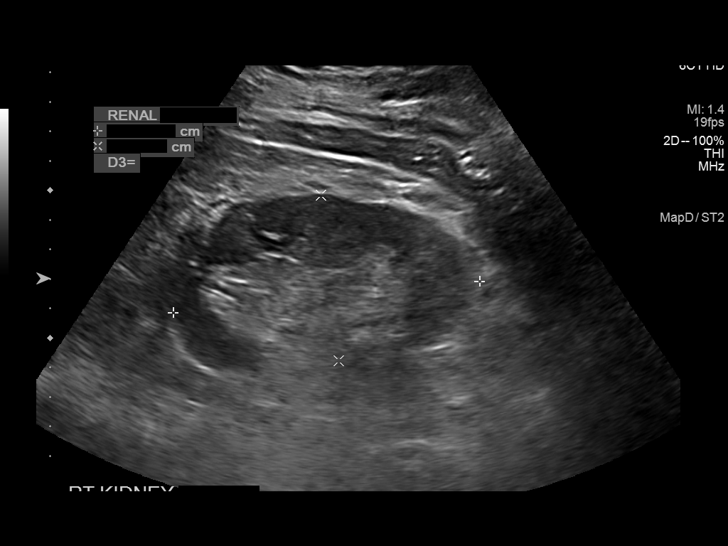
[im 9/34]
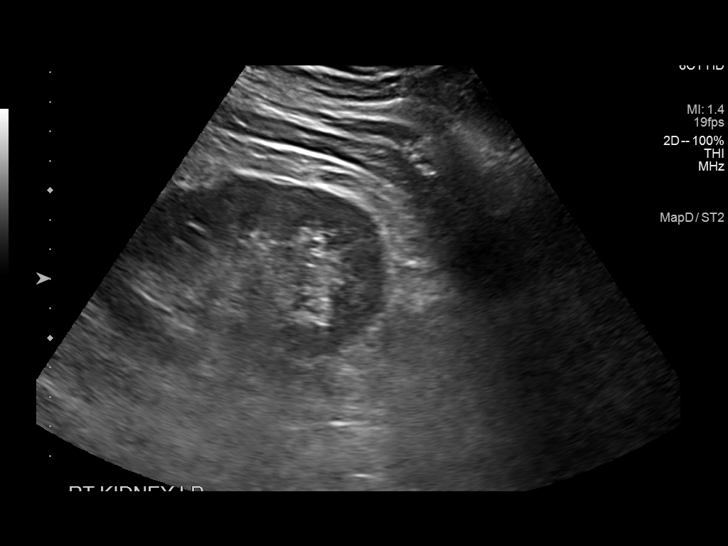
[im 12/34]
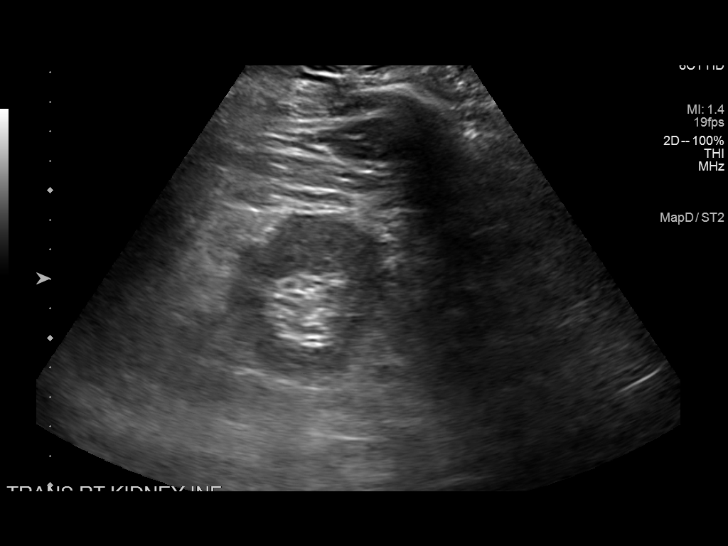
[im 13/34]
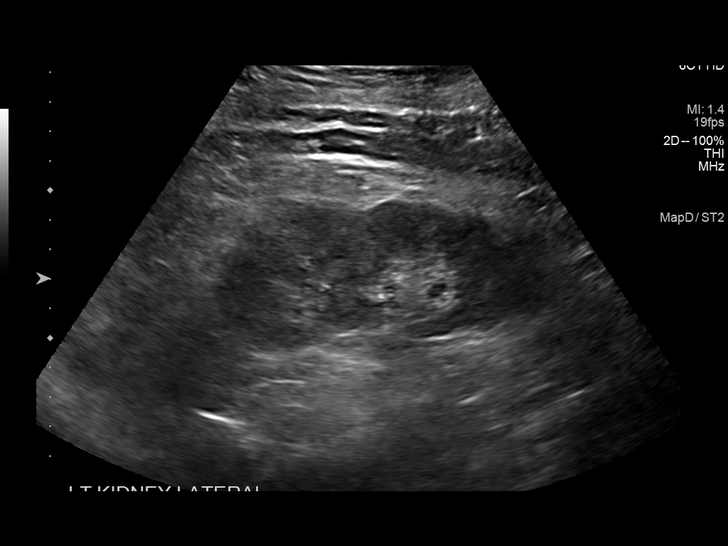
[im 16/34]
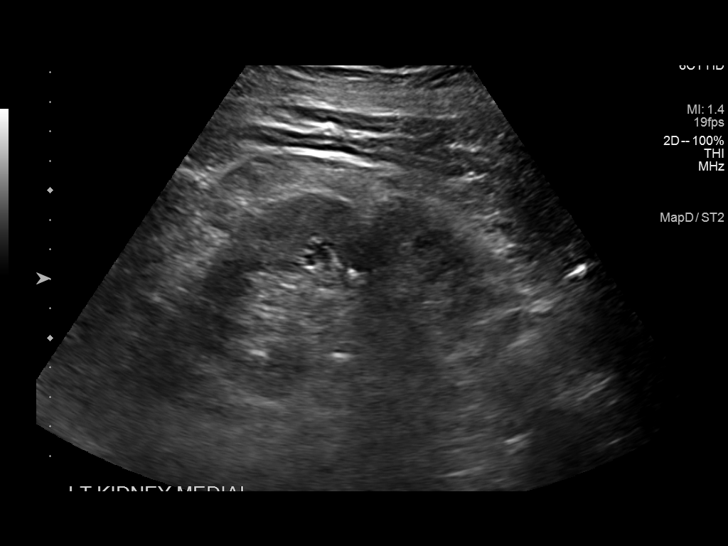
[im 18/34]
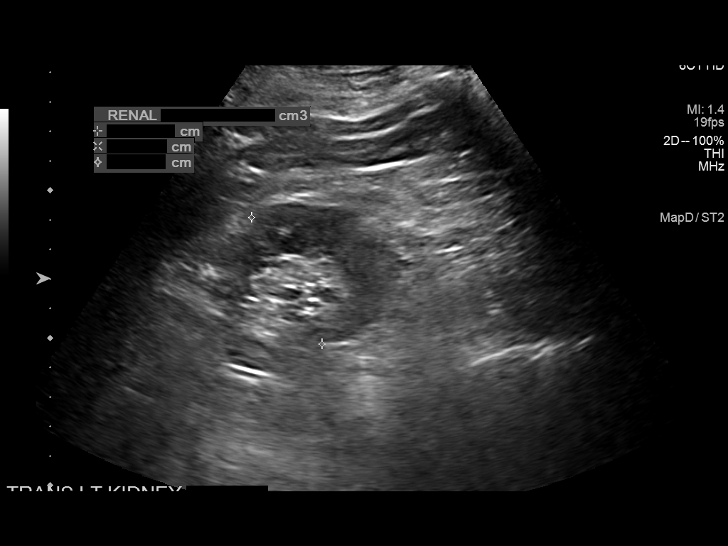
[im 21/34]
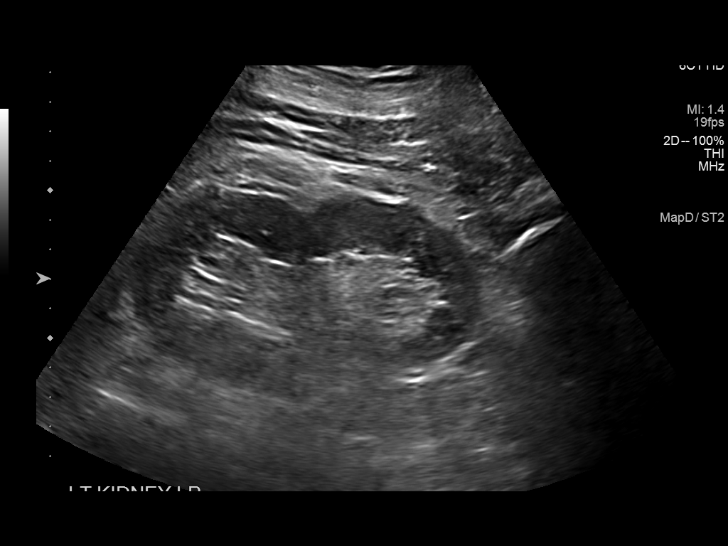
[im 23/34]
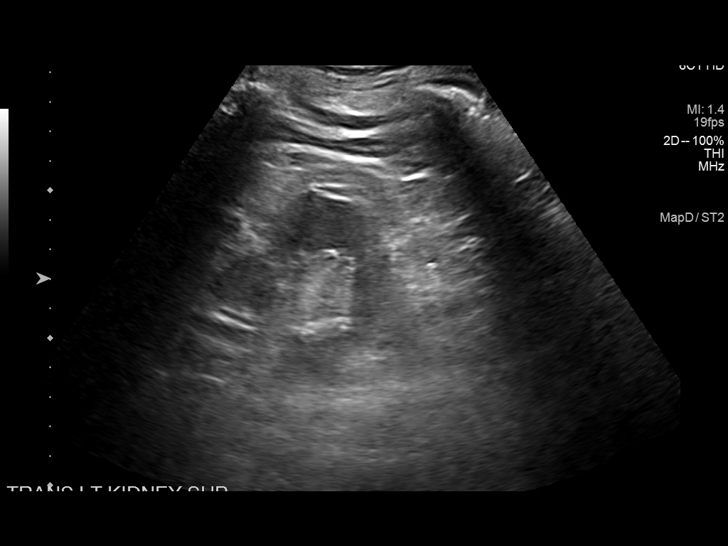
[im 25/34]
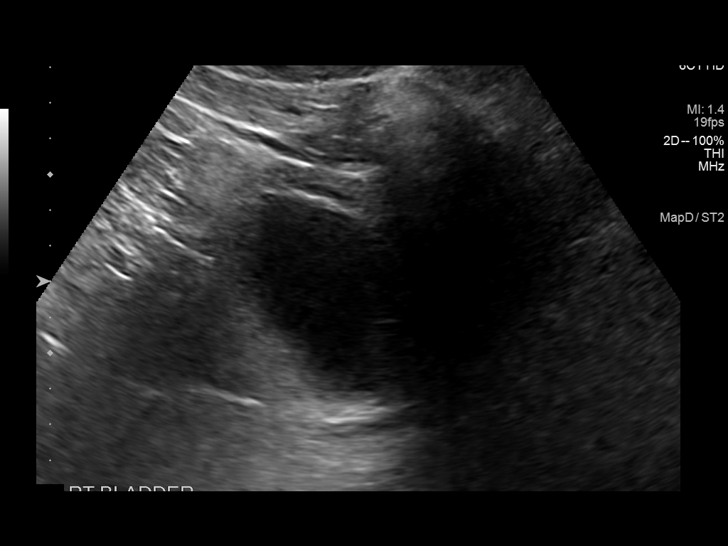
[im 28/34]
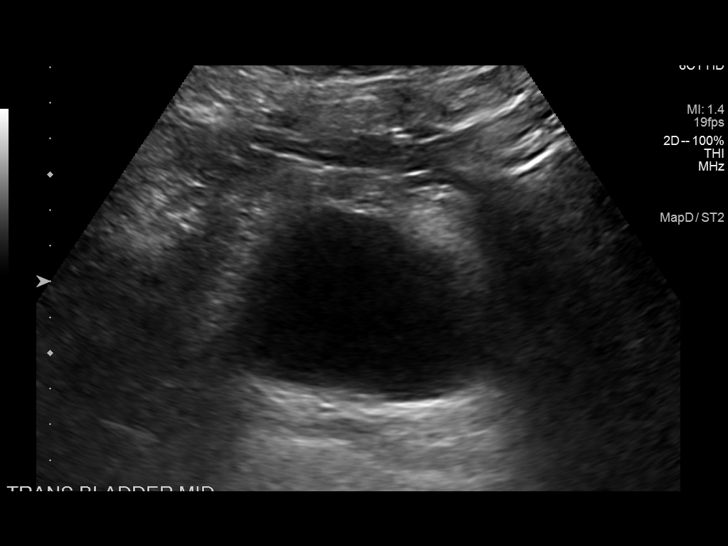
[im 31/34]
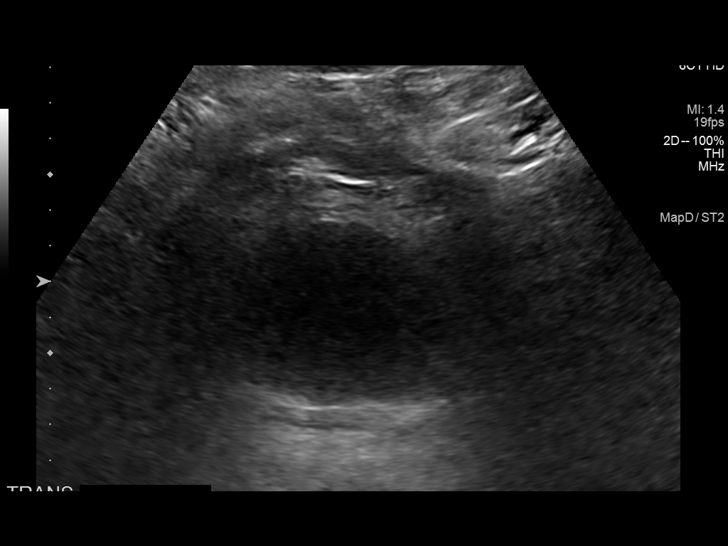
[im 34/34]
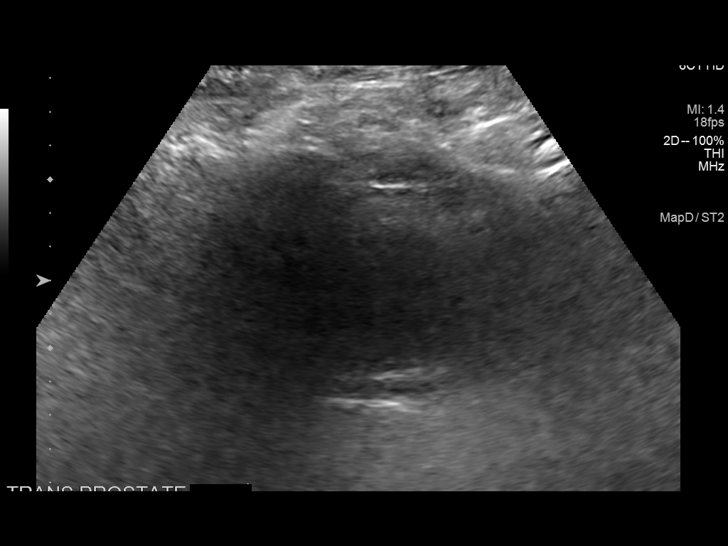

[14 of 25 positions shown; findings below may reference images not displayed]

FINDINGS: Right Kidney:

Renal measurements: 10.4 x 5.6 x 5.8 cm = volume: 180 mL.
Echogenicity within normal limits. No mass or hydronephrosis
visualized.

Left Kidney:

Renal measurements: 11.9 x 6.5 x 4.9 cm = volume: 200 mL.
Echogenicity within normal limits. No mass or hydronephrosis
visualized.

Bladder:

Appears normal for degree of bladder distention.

Other:

Prostate not well visualized due to shadowing from overlying bowel
gas.
IMPRESSION: Normal sonographic appearance of the kidneys and urinary bladder.
Specifically, no hydronephrosis.

## 2022-02-19 ENCOUNTER — Other Ambulatory Visit (HOSPITAL_COMMUNITY): Payer: Self-pay

## 2022-03-21 ENCOUNTER — Other Ambulatory Visit: Payer: Self-pay | Admitting: Pharmacist

## 2022-03-21 DIAGNOSIS — G4700 Insomnia, unspecified: Secondary | ICD-10-CM | POA: Diagnosis not present

## 2022-03-21 DIAGNOSIS — L239 Allergic contact dermatitis, unspecified cause: Secondary | ICD-10-CM | POA: Diagnosis not present

## 2022-03-21 DIAGNOSIS — N529 Male erectile dysfunction, unspecified: Secondary | ICD-10-CM | POA: Diagnosis not present

## 2022-03-21 DIAGNOSIS — D649 Anemia, unspecified: Secondary | ICD-10-CM | POA: Diagnosis not present

## 2022-03-21 DIAGNOSIS — E1169 Type 2 diabetes mellitus with other specified complication: Secondary | ICD-10-CM | POA: Diagnosis not present

## 2022-03-21 DIAGNOSIS — I10 Essential (primary) hypertension: Secondary | ICD-10-CM | POA: Diagnosis not present

## 2022-03-21 DIAGNOSIS — L0390 Cellulitis, unspecified: Secondary | ICD-10-CM | POA: Diagnosis not present

## 2022-03-21 DIAGNOSIS — Z23 Encounter for immunization: Secondary | ICD-10-CM | POA: Diagnosis not present

## 2022-03-21 MED ORDER — EVOLOCUMAB 140 MG/ML ~~LOC~~ SOAJ: 140.0000 mg | SUBCUTANEOUS | 11 refills | Status: DC

## 2022-04-02 ENCOUNTER — Telehealth: Payer: Self-pay | Admitting: Pharmacist

## 2022-04-02 NOTE — Telephone Encounter (Signed)
Repatha PA submitted. Key: [id number]. PA approved through 04/08/23

## 2022-04-29 DIAGNOSIS — I5030 Unspecified diastolic (congestive) heart failure: Secondary | ICD-10-CM | POA: Diagnosis not present

## 2022-04-29 DIAGNOSIS — R351 Nocturia: Secondary | ICD-10-CM | POA: Diagnosis not present

## 2022-04-29 DIAGNOSIS — E1122 Type 2 diabetes mellitus with diabetic chronic kidney disease: Secondary | ICD-10-CM | POA: Diagnosis not present

## 2022-04-29 DIAGNOSIS — E875 Hyperkalemia: Secondary | ICD-10-CM | POA: Diagnosis not present

## 2022-04-29 DIAGNOSIS — I130 Hypertensive heart and chronic kidney disease with heart failure and stage 1 through stage 4 chronic kidney disease, or unspecified chronic kidney disease: Secondary | ICD-10-CM | POA: Diagnosis not present

## 2022-04-29 DIAGNOSIS — E785 Hyperlipidemia, unspecified: Secondary | ICD-10-CM | POA: Diagnosis not present

## 2022-04-29 DIAGNOSIS — N1832 Chronic kidney disease, stage 3b: Secondary | ICD-10-CM | POA: Diagnosis not present

## 2022-05-07 DIAGNOSIS — H524 Presbyopia: Secondary | ICD-10-CM | POA: Diagnosis not present

## 2022-05-07 DIAGNOSIS — E119 Type 2 diabetes mellitus without complications: Secondary | ICD-10-CM | POA: Diagnosis not present

## 2022-05-07 DIAGNOSIS — Z794 Long term (current) use of insulin: Secondary | ICD-10-CM | POA: Diagnosis not present

## 2022-05-20 DIAGNOSIS — H2513 Age-related nuclear cataract, bilateral: Secondary | ICD-10-CM | POA: Diagnosis not present

## 2022-05-20 DIAGNOSIS — H2511 Age-related nuclear cataract, right eye: Secondary | ICD-10-CM | POA: Diagnosis not present

## 2022-06-21 DIAGNOSIS — E119 Type 2 diabetes mellitus without complications: Secondary | ICD-10-CM | POA: Diagnosis not present

## 2022-07-16 ENCOUNTER — Telehealth: Payer: Self-pay

## 2022-07-16 ENCOUNTER — Encounter: Payer: Self-pay | Admitting: Internal Medicine

## 2022-07-16 ENCOUNTER — Ambulatory Visit: Payer: Medicare HMO | Admitting: Internal Medicine

## 2022-07-16 VITALS — BP 124/80 | HR 61 | Ht 72.0 in | Wt 288.0 lb

## 2022-07-16 DIAGNOSIS — E1159 Type 2 diabetes mellitus with other circulatory complications: Secondary | ICD-10-CM

## 2022-07-16 DIAGNOSIS — E1122 Type 2 diabetes mellitus with diabetic chronic kidney disease: Secondary | ICD-10-CM

## 2022-07-16 DIAGNOSIS — Z794 Long term (current) use of insulin: Secondary | ICD-10-CM

## 2022-07-16 DIAGNOSIS — N184 Chronic kidney disease, stage 4 (severe): Secondary | ICD-10-CM | POA: Diagnosis not present

## 2022-07-16 LAB — POCT GLYCOSYLATED HEMOGLOBIN (HGB A1C): Hemoglobin A1C: 6.5 % — AB (ref 4.0–5.6)

## 2022-07-16 NOTE — Progress Notes (Signed)
Name: Mario Green  MRN/ DOB: [id number], 19-May-1955   Age/ Sex: 67 y.o., male    PCP: Simone Curia, MD   Reason for Endocrinology Evaluation: Type 2 Diabetes Mellitus     Date of Initial Endocrinology Visit: 06/22/2021    PATIENT IDENTIFIER: [name] is a 67 y.o. male with a past medical history of CAD, HTN, T2DM, CKD and depression . The patient presented for initial endocrinology clinic visit on 06/22/2021 for consultative assistance with his diabetes management.    HPI: [name] was    Diagnosed with DM years ago  Prior Medications tried/Intolerance: Metformin Hemoglobin A1c peaking at 8.0% in 04/2021.   Nephrology : Dr. Kendell Bane  Father with hx of DM    On his initial visit to our clinic his A1c was 7.6%  , he was on Levemir, Trulicity and Glimepiride. We switched Glimepiride to Novolog and continued basal insulin and Trulicity   He was started on Farxiga in August 2023 by PCP    SUBJECTIVE:   During last visit (01/09/2022): A1c 7.7%     Today (07/16/22):  Mr. [name] is here for a follow up on diabetes management.   He continues to follow-up with cardiology for history of carotid endarterectomy 07/2021 He continues to follow-up with nephrology Dr. Estill Bakes   Denies nausea, vomiting or diarrhea    HOME DIABETES REGIMEN: Novolog 10 units TIDQAC- takes  it with breakfast only  Trulicity 4.5 mg weekly - Pt assistance  Levemir 40 units daily - pt assistance  Correction factor: NovoLog (BG -130/30)     Statin: No, pt on Repatha  ACE-I/ARB: no Prior Diabetic Education: yes   CONTINUOUS GLUCOSE MONITORING RECORD INTERPRETATION    Dates of Recording: 3/24-07/15/2021  Sensor description:freestyle libre   Results statistics:   CGM use % of time 41  Average and SD 124/20.2  Time in range   97     %  % Time Above 180 3  % Time above 250 0  % Time Below target 0   Glycemic patterns summary: Bg's are optimal during  the day and night    Hyperglycemic episodes  rare  Hypoglycemic episodes occurred  rare  Overnight periods: stable      DIABETIC COMPLICATIONS: Microvascular complications:  CKD IV Denies: retinopathy, neuropathy  Last eye exam: Completed 05/20/2022  Macrovascular complications:  CAD, carotid artery disease Denies: PVD, CVA   PAST HISTORY: Past Medical History:  Past Medical History:  Diagnosis Date   Allergic rhinitis    Anemia    Benign essential hypertension    CAD (coronary artery disease)    CKD (chronic kidney disease), symptom management only, stage 3 (moderate)    Dyslipidemia 03/24/2015   ED (erectile dysfunction)    Hyperlipidemia    Morbid obesity due to excess calories 03/24/2015   Obstructive sleep apnea 03/24/2015   patient denied having a sleep study done   Renal insufficiency    Second degree atrioventricular block, Mobitz (type) I 07/17/2021   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 LIMA to LAD, SVG to diagonal 1, SVG to PDA   Tinnitus, bilateral    Type 2 diabetes mellitus without complication 03/24/2015   Past Surgical History:  Past Surgical History:  Procedure Laterality Date   ADENOIDECTOMY     CARDIAC CATHETERIZATION     CARDIAC SURGERY     Tripple bypass   COLONOSCOPY  ENDARTERECTOMY Right 07/31/2021   Procedure: Hematoma Evacuation Right Post Carotid Endarterectomy;  Surgeon: Maeola Harman, MD;  [facility];  Service: Vascular;  Laterality: Right;   ENDARTERECTOMY Right 07/31/2021   Procedure: RIGHT ENDARTERECTOMY CAROTID WITH PATCH ANGIOPLASTY;  Surgeon: Maeola Harman, MD;  Location: [facility] OR;  Service: Vascular;  Laterality: Right;    Social History:  reports that he has never smoked. He has never been exposed to tobacco smoke. He has quit using smokeless tobacco.  His smokeless tobacco use included chew. He reports that he does not currently use  alcohol. He reports that he does not use drugs. Family History:  Family History  Problem Relation Age of Onset   Dementia Mother    Diabetes Father    Heart attack Father      HOME MEDICATIONS: Allergies as of 07/16/2022   No Known Allergies      Medication List        Accurate as of July 16, 2022 11:11 AM. If you have any questions, ask your nurse or doctor.          STOP taking these medications    augmented betamethasone dipropionate 0.05 % cream Commonly known as: DIPROLENE-AF Stopped by: Scarlette Shorts, MD   metoprolol tartrate 25 MG tablet Commonly known as: LOPRESSOR Stopped by: Scarlette Shorts, MD   zolpidem 10 MG tablet Commonly known as: AMBIEN Stopped by: Scarlette Shorts, MD       TAKE these medications    amLODipine 2.5 MG tablet Commonly known as: NORVASC Take 2.5 mg by mouth at bedtime.   clopidogrel 75 MG tablet Commonly known as: PLAVIX Take 1 tablet (75 mg total) by mouth daily.   Evolocumab 140 MG/ML Soaj Inject 140 mg into the skin every 14 (fourteen) days. Inject 1 pen every 14 days subcutaneously   Farxiga 5 MG Tabs tablet Generic drug: dapagliflozin propanediol Take 1 tablet by mouth daily.   Fish Oil 1000 MG Caps Take 1,000 mg by mouth 2 (two) times daily.   insulin detemir 100 UNIT/ML FlexPen Commonly known as: LEVEMIR Inject 40 Units into the skin daily.   Insulin Pen Needle 29G X Misc 1 Device by Does not apply route in the morning, at noon, in the evening, and at bedtime.   NovoLOG FlexPen 100 UNIT/ML FlexPen Generic drug: insulin aspart Max daily 50 units   tamsulosin 0.4 MG Caps capsule Commonly known as: FLOMAX Take 0.4 mg by mouth at bedtime.   Trulicity 4.5 MG/0.5ML Sopn Generic drug: Dulaglutide Inject 4.5 mg into the skin every Monday.         ALLERGIES: No Known Allergies   REVIEW OF SYSTEMS: A comprehensive ROS was conducted with the patient and is negative except as per  HPI     OBJECTIVE:   VITAL SIGNS: BP 124/80 (BP Location: Left Arm, Patient Position: Sitting, Cuff Size: Large)   Pulse 61   Ht 6' (1.829 m)   Wt 288 lb (130.6 kg)   SpO2 97%   BMI 39.06 kg/m    PHYSICAL EXAM:  General: Pt appears well and is in NAD  Neck: General: Supple without adenopathy or carotid bruits. Thyroid: Thyroid size normal.  No goiter or nodules appreciated.  Lungs: Clear with good BS bilat with no rales, rhonchi, or wheezes  Heart: RRR   Neuro: MS is good with appropriate affect, pt is alert and Ox3    DM Foot Exam 07/16/2022 The skin of the feet  is intact without sores or ulcerations. The pedal pulses are 2+ on right and 2+ on left. The sensation is intact to a screening 5.07, 10 gram monofilament bilaterally    DATA REVIEWED:   ASSESSMENT / PLAN / RECOMMENDATIONS:   1) Type 2 Diabetes Mellitus, Optimally ptimally controlled, With CKD IV and macrovascular  complications - Most recent A1c of 6.5%. Goal A1c < 7.0 %.    -A1c at goal -Limited glycemic agents due to CKD III- IV -Patient is on patient assistance program for Levemir, Novolog and Trulicity  -To simplify as patient assistance process, we have opted to switch Trulicity to Ozempic that way he can have 1 application for his insulin and Trulicity -Levemir is going to be discontinued by the manufacturer, will fill the application for [place] -I will decrease his Levemir/Tresiba as below -He has been taking NovoLog once daily with breakfast only, he was encouraged to continue to use correction scale for NovoLog before each meal  MEDICATIONS: Continue NovoLog 10 units with breakfast Switch Levemir to [place] and take 36 units daily Continue correction factor: NovoLog (BG -130/30) Switch Trulicity to Ozempic 2 mg weekly  EDUCATION / INSTRUCTIONS: BG monitoring instructions: Patient is instructed to check his blood sugars 3 times a day, before meals. Call [facility] if: BG  persistently < 70 I reviewed the Rule of 15 for the treatment of hypoglycemia in detail with the patient. Literature supplied.   2) Diabetic complications:  Eye: Does not have known diabetic retinopathy.  Neuro/ Feet: Does not have known diabetic peripheral neuropathy. Renal: Patient does  have known baseline CKD. He is  on an ACEI/ARB at present.    3)CAD/Dyslipidemia/carotid artery disease:   -Per cardiology -Patient on Repatha    Follow-up in 6 months    Signed electronically by: Lyndle HerrlichAbby Jaralla Bowie Doiron, MD  [facility]  [facility] [address] [place], [place] [address] Phone: [phone] FAX: [phone]   CC: Simone CuriaLee, Keung, MD [address] [place] [address] Phone: [phone]  Fax: [phone]    Return to Endocrinology clinic as below: No future appointments.

## 2022-07-16 NOTE — Telephone Encounter (Signed)
[employer] patient assistance forms faxed

## 2022-07-16 NOTE — Patient Instructions (Addendum)
-   Will switch Trulicity to Ozempic 2 mg once weekly  - Continue Novolog 10 units with Breakfast only  - Decrease Levemir 36 units ONCE daily ( will change this to [place] )  - Novolog correctional insulin: ADD extra units on insulin to your meal-time Novolog dose if your blood sugars are higher than 160. Use the scale below to help guide you:   Blood sugar before meal Number of units to inject  Less than 160 0 unit  161 -  190 1 units  191 -  220 2 units  221 -  250 3 units  251 -  280 4 units  281 -  310 5 units  311 -  340 6 units  341 -  370 7 units  371 -  400 8 units     HOW TO TREAT LOW BLOOD SUGARS (Blood sugar LESS THAN 70 MG/DL) Please follow the RULE OF 15 for the treatment of hypoglycemia treatment (when your (blood sugars are less than 70 mg/dL)   STEP 1: Take 15 grams of carbohydrates when your blood sugar is low, which includes:  3-4 GLUCOSE TABS  OR 3-4 OZ OF JUICE OR REGULAR SODA OR ONE TUBE OF GLUCOSE GEL    STEP 2: RECHECK blood sugar in 15 MINUTES STEP 3: If your blood sugar is still low at the 15 minute recheck --> then, go back to STEP 1 and treat AGAIN with another 15 grams of carbohydrates.

## 2022-07-22 ENCOUNTER — Telehealth: Payer: Self-pay

## 2022-07-22 ENCOUNTER — Other Ambulatory Visit: Payer: Self-pay | Admitting: Cardiology

## 2022-07-22 NOTE — Telephone Encounter (Signed)
Patient will stop by to fill out a section of the [facility] application that was missing

## 2022-07-23 ENCOUNTER — Telehealth: Payer: Self-pay | Admitting: Cardiology

## 2022-07-23 ENCOUNTER — Other Ambulatory Visit: Payer: Self-pay

## 2022-07-23 ENCOUNTER — Telehealth: Payer: Self-pay

## 2022-07-23 NOTE — Telephone Encounter (Signed)
Pt c/o medication issue:  1. Name of Medication: Metoprolol   2. How are you currently taking this medication (dosage and times per day)?   3. Are you having a reaction (difficulty breathing--STAT)?   4. What is your medication issue? Pt states he is out of the medication, however it is no longer on his medication list.  Discontinued by Endocrinology on 07/16/22. Please advise.

## 2022-07-23 NOTE — Telephone Encounter (Signed)
Called patient and he reported that he needed his Metoprolol medication re-filled. I informed him that it was stopped during his Endocrinology appointment on 07/16/22. Patient stated that was an error and they were supposed to stop his Metformin. I explained that he would need to reach out to his Endocrinology office to see if they made an error and if they wanted to restart the medication. Patient stated that he would reach out to the Endocrinology office and had no further questions at this time.

## 2022-07-23 NOTE — Telephone Encounter (Signed)
Refill request for metoprolol denied, record shows this medication was stopped 07/16/22 by another provider.

## 2022-07-25 ENCOUNTER — Telehealth: Payer: Self-pay

## 2022-07-25 MED ORDER — METOPROLOL TARTRATE 25 MG PO TABS: 25.0000 mg | ORAL_TABLET | Freq: Every day | ORAL | 3 refills | Status: DC

## 2022-07-25 NOTE — Telephone Encounter (Signed)
Patient is calling back stating that he did speak with his Endocrinology office who states they were not the one to discontinue this medication and would like a call back to see what he needs to do to get this refilled. Please advise.   Patient is now completely out of this medication

## 2022-07-25 NOTE — Telephone Encounter (Signed)
Returned call to patient inform him that new Rx sent to pharmacy for his Metoprolol tartrate 25 mg 1 tablet daily. Patient thanked me for calling and had no other questions

## 2022-07-25 NOTE — Telephone Encounter (Signed)
Patient is requesting [place] to refill his Metoprolol. States that it was d/c by [place] and he is still on it.

## 2022-07-25 NOTE — Telephone Encounter (Signed)
Patient advised and states that he will reach back out to his cardiologist

## 2022-08-02 ENCOUNTER — Telehealth: Payer: Self-pay

## 2022-08-02 NOTE — Telephone Encounter (Signed)
Pt advised patient assistance delivered and ready for pick up.  Insulin Degludec Evaristo Bury) 3 boxes

## 2022-08-06 NOTE — Telephone Encounter (Signed)
Mario Green patient assistance picked up by patient

## 2022-09-17 ENCOUNTER — Other Ambulatory Visit: Payer: Self-pay | Admitting: *Deleted

## 2022-09-17 DIAGNOSIS — I6521 Occlusion and stenosis of right carotid artery: Secondary | ICD-10-CM

## 2022-09-18 NOTE — Progress Notes (Signed)
HISTORY AND PHYSICAL     CC:  follow up. Requesting Provider:  Simone Curia, MD  HPI: This is a 67 y.o. male here for follow up for carotid artery stenosis.  Pt is s/p right CEA for asymptomatic carotid artery stenosis on 07/31/2021 by Dr. Randie Heinz.    He returned to the OR later that day for exploration of right neck incision with evacuation of hematoma and placement of JP drain also by Dr. Randie Heinz.  Pt was last seen 08/29/2021 by Dr. Randie Heinz and at that time his hematoma had resolved. He was not having any complaints.   Pt returns today for follow up.    Pt denies any amaurosis fugax, speech difficulties, weakness, numbness, paralysis or clumsiness or facial droop.  He has an area underneath his jaw close to his ear that he says has some soreness to touch since surgery, otherwise, it is tolerable.  He had cataract surgery since his last visit.   Pt has a place up in [place] that he hopes to move to in the future.   The pt is not on a statin for cholesterol management. Intolerance.  He is on Repatha.  The pt is not on a daily aspirin.   Other AC:  Plavix The pt is on CCB, BB for hypertension.   The pt is  on medication for diabetes Tobacco hx:  never  Pt does not have family hx of AAA.  Past Medical History:  Diagnosis Date   Allergic rhinitis    Anemia    Benign essential hypertension    CAD (coronary artery disease)    CKD (chronic kidney disease), symptom management only, stage 3 (moderate) (HCC)    Dyslipidemia 03/24/2015   ED (erectile dysfunction)    Hyperlipidemia    Morbid obesity due to excess calories (HCC) 03/24/2015   Obstructive sleep apnea 03/24/2015   patient denied having a sleep study done   Renal insufficiency    Second degree atrioventricular block, Mobitz (type) I 07/17/2021   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 LIMA to LAD, SVG to diagonal 1, SVG to PDA   Tinnitus, bilateral    Type 2  diabetes mellitus without complication (HCC) 03/24/2015    Past Surgical History:  Procedure Laterality Date   ADENOIDECTOMY     CARDIAC CATHETERIZATION     CARDIAC SURGERY     Tripple bypass   COLONOSCOPY     ENDARTERECTOMY Right 07/31/2021   Procedure: Hematoma Evacuation Right Post Carotid Endarterectomy;  Surgeon: Maeola Harman, MD;  Location: [facility] OR;  Service: Vascular;  Laterality: Right;   ENDARTERECTOMY Right 07/31/2021   Procedure: RIGHT ENDARTERECTOMY CAROTID WITH PATCH ANGIOPLASTY;  Surgeon: Maeola Harman, MD;  Location: [facility] OR;  Service: Vascular;  Laterality: Right;    No Known Allergies  Current Outpatient Medications  Medication Sig Dispense Refill   metoprolol tartrate (LOPRESSOR) 25 MG tablet Take 1 tablet (25 mg total) by mouth daily. 90 tablet 3   amLODipine (NORVASC) 2.5 MG tablet Take 2.5 mg by mouth at bedtime.     clopidogrel (PLAVIX) 75 MG tablet Take 1 tablet (75 mg total) by mouth daily. 90 tablet 3   Dulaglutide (TRULICITY) 4.5 MG/0.5ML SOPN Inject 4.5 mg into the skin every Monday.     Evolocumab 140 MG/ML SOAJ Inject 140 mg into the skin every 14 (fourteen) days. Inject 1 pen every 14 days subcutaneously 2 mL 11  FARXIGA 5 MG TABS tablet Take 1 tablet by mouth daily.     insulin aspart (NOVOLOG FLEXPEN) 100 UNIT/ML FlexPen Max daily 50 units 45 mL 4   insulin detemir (LEVEMIR) 100 UNIT/ML FlexPen Inject 40 Units into the skin daily. 45 mL 3   Insulin Pen Needle 29G X MISC 1 Device by Does not apply route in the morning, at noon, in the evening, and at bedtime. 400 each 3   Omega-3 Fatty Acids (FISH OIL) 1000 MG CAPS Take 1,000 mg by mouth 2 (two) times daily.     tamsulosin (FLOMAX) 0.4 MG CAPS capsule Take 0.4 mg by mouth at bedtime.     No current facility-administered medications for this visit.    Family History  Problem Relation Age of Onset   Dementia Mother    Diabetes Father    Heart attack Father     Social  History   Socioeconomic History   Marital status: Single    Spouse name: Not on file   Number of children: Not on file   Years of education: Not on file   Highest education level: Not on file  Occupational History   Not on file  Tobacco Use   Smoking status: Never    Passive exposure: Never   Smokeless tobacco: Former    Types: Associate Professor Use: Never used  Substance and Sexual Activity   Alcohol use: Not Currently    Comment: occasional   Drug use: Never   Sexual activity: Not on file  Other Topics Concern   Not on file  Social History Narrative   Not on file   Social Determinants of Health   Financial Resource Strain: Not on file  Food Insecurity: Not on file  Transportation Needs: Not on file  Physical Activity: Not on file  Stress: Not on file  Social Connections: Not on file  Intimate Partner Violence: Not on file     REVIEW OF SYSTEMS:   [X]  denotes positive finding, [ ]  denotes negative finding Cardiac  Comments:  Chest pain or chest pressure:    Shortness of breath upon exertion:    Short of breath when lying flat:    Irregular heart rhythm:        Vascular    Pain in calf, thigh, or hip brought on by ambulation:    Pain in feet at night that wakes you up from your sleep:     Blood clot in your veins:    Leg swelling:         Pulmonary    Oxygen at home:    Productive cough:     Wheezing:         Neurologic    Sudden weakness in arms or legs:     Sudden numbness in arms or legs:     Sudden onset of difficulty speaking or slurred speech:    Temporary loss of vision in one eye:     Problems with dizziness:         Gastrointestinal    Blood in stool:     Vomited blood:         Genitourinary    Burning when urinating:     Blood in urine:        Psychiatric    Major depression:         Hematologic    Bleeding problems:    Problems with blood clotting too easily:  Skin    Rashes or ulcers:        Constitutional     Fever or chills:      PHYSICAL EXAMINATION:  Today's Vitals   09/25/22 1027 09/25/22 1030  BP: 120/75 110/67  Pulse: (!) 57   Temp: 97.6 F (36.4 C)   TempSrc: Temporal   SpO2: 94%   Weight: 282 lb (127.9 kg)    Body mass index is 38.25 kg/m.   General:  WDWN in NAD; vital signs documented above Gait: Not observed HENT: WNL, normocephalic Pulmonary: normal non-labored breathing Cardiac: regular HR, without carotid bruits Abdomen: soft, NT; aortic pulse is not palpable Skin: without rashes Vascular Exam/Pulses:  Right Left  Radial 2+ (normal) 2+ (normal)   Extremities: without open wounds Musculoskeletal: no muscle wasting or atrophy  Neurologic: A&O X 3; moving all extremities equally; speech is fluent/normal Psychiatric:  The pt has Normal affect.   Non-Invasive Vascular Imaging:   Carotid Duplex on 09/25/2022 Right:  near normal Left:  1-39% ICA stenosis Vertebrals:  Bilateral vertebral arteries demonstrate antegrade flow.  Subclavians: Normal flow hemodynamics were seen in bilateral subclavian arteries.   Previous Carotid duplex on 06/29/2021: Right: 80-99% ICA stenosis Left:   1-39% ICA stenosis    ASSESSMENT/PLAN:: 67 y.o. male here for follow up carotid artery stenosis and s/p right CEA for asymptomatic carotid artery stenosis on 07/31/2021 by Dr. Randie Heinz.    He returned to the OR later that day for exploration of right neck incision with evacuation of hematoma and placement of JP drain also by Dr. Randie Heinz.  -duplex today reveals right is near normal and left remains in the 1-39% ICA stenosis -discussed s/s of stroke with pt and he understands should he develop any of these sx, he will go to the nearest ER or call 911. -pt will f/u in one year with carotid duplex -pt will call sooner should he have any issues. -continue Plavix and Repatha.     Doreatha Massed, [facility] [phone]  Clinic MD:  Edilia Bo

## 2022-09-25 ENCOUNTER — Ambulatory Visit: Payer: Medicare HMO | Admitting: Physician Assistant

## 2022-09-25 ENCOUNTER — Encounter: Payer: Self-pay | Admitting: Physician Assistant

## 2022-09-25 ENCOUNTER — Ambulatory Visit (HOSPITAL_COMMUNITY)
Admission: RE | Admit: 2022-09-25 | Discharge: 2022-09-25 | Disposition: A | Discharge status: Home / Self Care | Payer: Medicare HMO | Source: Ambulatory Visit | Attending: Vascular Surgery | Admitting: Vascular Surgery

## 2022-09-25 VITALS — BP 110/67 | HR 57 | Temp 97.6°F | Wt 282.0 lb

## 2022-09-25 DIAGNOSIS — I6521 Occlusion and stenosis of right carotid artery: Secondary | ICD-10-CM | POA: Diagnosis present

## 2022-09-25 DIAGNOSIS — I6529 Occlusion and stenosis of unspecified carotid artery: Secondary | ICD-10-CM | POA: Diagnosis not present

## 2022-10-05 ENCOUNTER — Other Ambulatory Visit: Payer: Self-pay

## 2022-10-05 DIAGNOSIS — I6529 Occlusion and stenosis of unspecified carotid artery: Secondary | ICD-10-CM

## 2022-10-23 IMAGING — DX DG CHEST 1V PORT
1 series · 1 of 1 positions shown · non-contrast
Comparison: Chest radiograph 04/24/2015

CLINICAL DATA: Endotracheally intubated.

EXAM:
PORTABLE CHEST 1 VIEW

[chest]
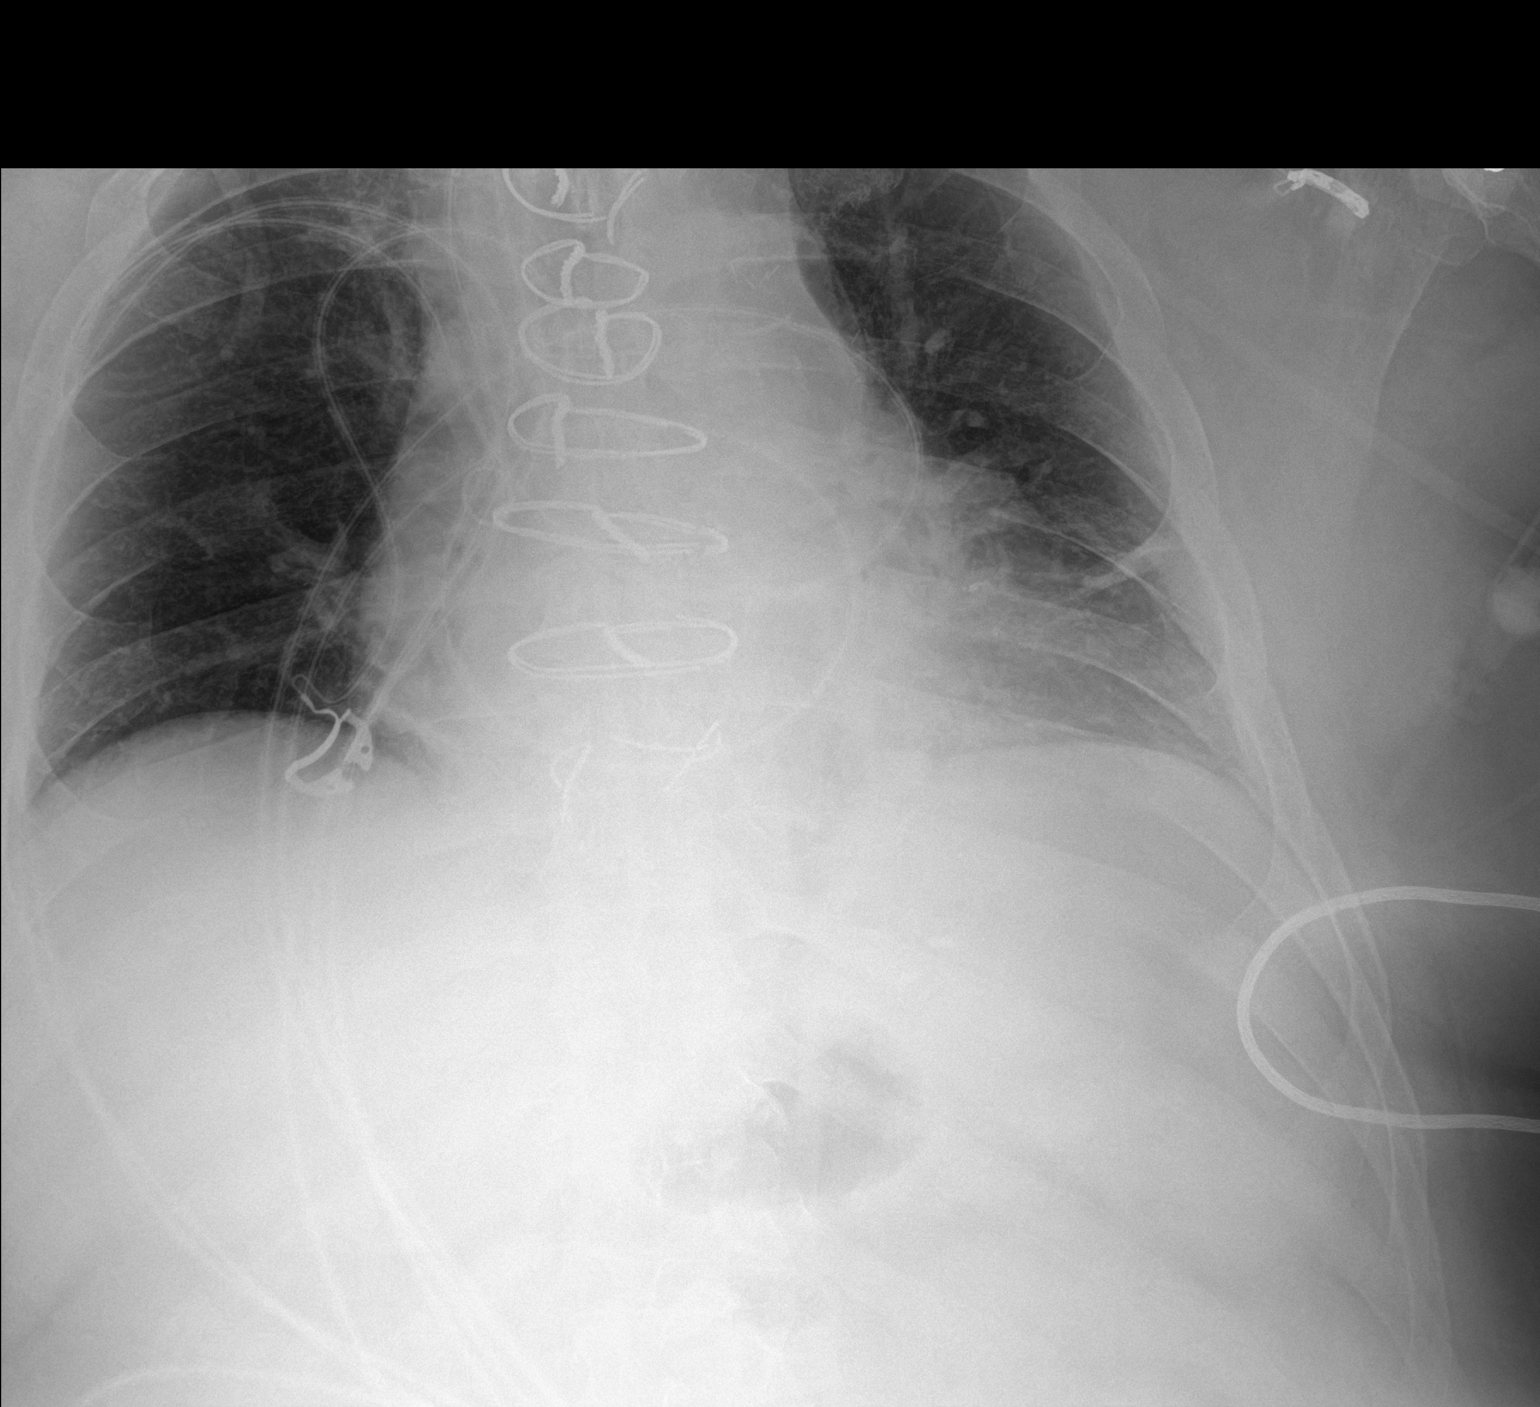

[1 of 1 positions shown; findings below may reference images not displayed]

FINDINGS: Endotracheal tube tip is at the level of the clavicular heads. The
carina is difficult to define on the current exam. There multiple
overlying monitoring devices in place. Right internal jugular
Swan-Ganz catheter versus overlying external monitoring device,
difficult to accurately assess.

The lung apices are excluded from the field of view. Patient is post
median sternotomy. The upper most and lower most sternal wires again
noted to be broken. The heart is enlarged. Streaky left lung base
opacities typical of atelectasis. No convincing pulmonary edema. No
large pleural effusion. No pneumothorax, although the apices are not
well assessed.
IMPRESSION: 1. Endotracheal tube tip at the level of the clavicular heads.
2. Cardiomegaly post median sternotomy.
3. Streaky left lung base opacities typical of atelectasis.
4. Portions of the lung apices are excluded from the field of view.

## 2022-11-06 DIAGNOSIS — E785 Hyperlipidemia, unspecified: Secondary | ICD-10-CM | POA: Insufficient documentation

## 2022-11-06 NOTE — Progress Notes (Unsigned)
Cardiology Office Note:    Date:  11/07/2022   ID:  Mario Green, DOB 12/03/1955, MRN [id number]  PCP:  Simone Curia, MD  Cardiologist:  Norman Herrlich, MD    Referring MD: Simone Curia, MD    ASSESSMENT:    1. Coronary artery disease of native artery of native heart with stable angina pectoris (HCC)   2. Type 2 diabetes mellitus without complication, unspecified whether long term insulin use (HCC)   3. Mixed hyperlipidemia   4. CKD (chronic kidney disease), symptom management only, stage 3 (moderate) (HCC)   5. Stenosis of right carotid artery   6. Erectile dysfunction, unspecified erectile dysfunction type    PLAN:    In order of problems listed above:  Mario Green continues to do well following his CABG having no angina will continue treatment including clopidogrel lipid-lowering with Repatha and alter his antihypertensive medications in view of his erectile dysfunction if unimproved would benefit from seeing a urologist Stable diabetes managed by his PCP uses insulin as well as semi-" Continue nonstatin lipid-lowering therapy Repatha CKD followed by his PCP Continue medical therapy with clopidogrel and Repatha   Next appointment: 6 months   Medication Adjustments/Labs and Tests Ordered: Current medicines are reviewed at length with the patient today.  Concerns regarding medicines are outlined above.  Orders Placed This Encounter  Procedures   EKG 12-Lead   Meds ordered this encounter  Medications   diltiazem (CARDIZEM CD) 240 MG 24 hr capsule    Sig: Take 1 capsule (240 mg total) by mouth daily.    Dispense:  90 capsule    Refill:  3     History of Present Illness:    Mario Green is a 67 y.o. male with a hx of CAD with CABG in 2016 stage III CKD with type 2 diabetes hyperlipidemia carotid disease with right carotid endarterectomy last seen 11/12/2021.  Compliance with diet, lifestyle and medications: Yes  From a cardiology perspective Keilon has done well in the  last year no hospitalizations on good medical regimen and is not having angina edema shortness of breath chest pain palpitation or syncope he is statin intolerant and uses PCSK9 inhibitor his primary concern is erectile dysfunction I think he would do well to move away from a beta-blocker we will discontinue amlodipine metoprolol and substitute rate limiting Cardizem CD If unimproved I think he benefit from seeing a urologist as he tells me that he had failed both Viagra and Cialis. His last lipid profile I can access is 08/20/2021 LDL 109 cholesterol 169 A1c 6.5 hemoglobin 11 creatinine 2.25 He tells me he had full labs at his PCP office in the last few months Past Medical History:  Diagnosis Date   Acute respiratory failure (HCC)    Allergic rhinitis    Anemia    Benign essential hypertension    CAD (coronary artery disease)    Carotid artery stenosis 07/31/2021   Carotid stenosis 07/31/2021   CKD (chronic kidney disease), symptom management only, stage 3 (moderate) (HCC)    Diabetes mellitus (HCC) 06/22/2021   Dyslipidemia 03/24/2015   ED (erectile dysfunction)    Hematoma of neck    Hyperlipidemia    Morbid obesity due to excess calories (HCC) 03/24/2015   Obstructive sleep apnea 03/24/2015   patient denied having a sleep study done   Renal insufficiency    Second degree atrioventricular block, Mobitz (type) I 07/17/2021   Status post coronary artery bypass graft 03/24/2015   Formatting of  this note might be different from the original. LIMA to LAD, SVG to diagonal 1, SVG to PDA   Tinnitus, bilateral    Type 2 diabetes mellitus with stage 4 chronic kidney disease, with long-term current use of insulin (HCC) 06/22/2021   Type 2 diabetes mellitus without complication (HCC) 03/24/2015    Current Medications: Current Meds  Medication Sig   clopidogrel (PLAVIX) 75 MG tablet Take 1 tablet (75 mg total) by mouth daily.   diltiazem (CARDIZEM CD) 240 MG 24 hr capsule Take 1 capsule  (240 mg total) by mouth daily.   Evolocumab 140 MG/ML SOAJ Inject 140 mg into the skin every 14 (fourteen) days. Inject 1 pen every 14 days subcutaneously   FARXIGA 5 MG TABS tablet Take 1 tablet by mouth daily.   insulin aspart (NOVOLOG FLEXPEN) 100 UNIT/ML FlexPen Max daily 50 units   insulin detemir (LEVEMIR) 100 UNIT/ML FlexPen Inject 40 Units into the skin daily.   Insulin Pen Needle 29G X MISC 1 Device by Does not apply route in the morning, at noon, in the evening, and at bedtime.   Omega-3 Fatty Acids (FISH OIL) 1000 MG CAPS Take 2,000 mg by mouth every morning.   Semaglutide (OZEMPIC, 2 MG/DOSE, [place]) Inject 2 mg into the skin once a week.   tamsulosin (FLOMAX) 0.4 MG CAPS capsule Take 0.4 mg by mouth at bedtime.   [DISCONTINUED] amLODipine (NORVASC) 2.5 MG tablet Take 2.5 mg by mouth at bedtime.   [DISCONTINUED] metoprolol tartrate (LOPRESSOR) 25 MG tablet Take 1 tablet (25 mg total) by mouth daily.      EKGs/Labs/Other Studies Reviewed:    The following studies were reviewed today:  Cardiac Studies & Procedures       ECHOCARDIOGRAM  ECHOCARDIOGRAM COMPLETE 08/01/2021  Narrative ECHOCARDIOGRAM REPORT    Patient Name:   Mario Green Date of Exam: 08/01/2021 Medical Rec #:  [id number]       Height:       72.0 in Accession #:    [id number]      Weight:       300.0 lb Date of Birth:  1956-03-17       BSA:          2.531 m Patient Age:    65 years        BP:           138/74 mmHg Patient Gender: M               HR:           45 bpm. Exam Location:  Inpatient  Procedure: 2D Echo, Cardiac Doppler, Color Doppler and Intracardiac Opacification Agent  Indications:    I42.9 Cardiomyopathy (unspecified)  History:        Patient has no prior history of Echocardiogram examinations. CAD, Prior CABG, Signs/Symptoms:Dyspnea and Shortness of Breath; Risk Factors:Diabetes and Sleep Apnea.  Sonographer:    Sheralyn Boatman RDCS Referring Phys: [id number] Migdalia Dk   Sonographer Comments: Technically difficult study due to poor echo windows and patient is morbidly obese. Image acquisition challenging due to respiratory motion. IMPRESSIONS   1. Left ventricular ejection fraction, by estimation, is 55 to 60%. The left ventricle has normal function. The left ventricle has no regional wall motion abnormalities. There is mild concentric left ventricular hypertrophy. Left ventricular diastolic parameters are consistent with Grade II diastolic dysfunction (pseudonormalization). Elevated left ventricular end-diastolic pressure. 2. Right ventricular systolic function is normal. The right ventricular size is  normal. 3. Left atrial size was mildly dilated. 4. Right atrial size was mildly dilated. 5. The mitral valve is normal in structure. No evidence of mitral valve regurgitation. No evidence of mitral stenosis. 6. The aortic valve is normal in structure. Aortic valve regurgitation is not visualized. No aortic stenosis is present. 7. Aortic dilatation noted. There is mild dilatation of the aortic root, measuring 37 mm. There is mild dilatation of the ascending aorta, measuring 40 mm.  FINDINGS Left Ventricle: Left ventricular ejection fraction, by estimation, is 55 to 60%. The left ventricle has normal function. The left ventricle has no regional wall motion abnormalities. Definity contrast agent was given IV to delineate the left ventricular endocardial borders. The left ventricular internal cavity size was normal in size. There is mild concentric left ventricular hypertrophy. Left ventricular diastolic parameters are consistent with Grade II diastolic dysfunction (pseudonormalization). Elevated left ventricular end-diastolic pressure.  Right Ventricle: The right ventricular size is normal. No increase in right ventricular wall thickness. Right ventricular systolic function is normal.  Left Atrium: Left atrial size was mildly dilated.  Right Atrium: Right  atrial size was mildly dilated.  Pericardium: There is no evidence of pericardial effusion.  Mitral Valve: The mitral valve is normal in structure. No evidence of mitral valve regurgitation. No evidence of mitral valve stenosis.  Tricuspid Valve: The tricuspid valve is normal in structure. Tricuspid valve regurgitation is trivial. No evidence of tricuspid stenosis.  Aortic Valve: The aortic valve is normal in structure. Aortic valve regurgitation is not visualized. No aortic stenosis is present.  Pulmonic Valve: The pulmonic valve was normal in structure. Pulmonic valve regurgitation is not visualized. No evidence of pulmonic stenosis.  Aorta: The ascending aorta was not well visualized and aortic dilatation noted. There is mild dilatation of the aortic root, measuring 37 mm. There is mild dilatation of the ascending aorta, measuring 40 mm.  Venous: IVC assessment for right atrial pressure unable to be performed due to mechanical ventilation.  IAS/Shunts: No atrial level shunt detected by color flow Doppler.   LEFT VENTRICLE PLAX 2D LVIDd:         5.00 cm      Diastology LVIDs:         3.35 cm      LV e' medial:    5.48 cm/s LV PW:         1.30 cm      LV E/e' medial:  21.4 LV IVS:        1.30 cm      LV e' lateral:   7.35 cm/s LVOT diam:     2.20 cm      LV E/e' lateral: 15.9 LV SV:         102 LV SV Index:   40 LVOT Area:     3.80 cm  LV Volumes (MOD) LV vol d, MOD A2C: 162.0 ml LV vol d, MOD A4C: 167.0 ml LV vol s, MOD A2C: 79.0 ml LV vol s, MOD A4C: 56.9 ml LV SV MOD A2C:     83.0 ml LV SV MOD A4C:     167.0 ml LV SV MOD BP:      99.3 ml  RIGHT VENTRICLE             IVC RV S prime:     12.38 cm/s  IVC diam: 2.20 cm TAPSE (M-mode): 1.6 cm  LEFT ATRIUM             Index  RIGHT ATRIUM           Index LA diam:        3.40 cm 1.34 cm/m   RA Area:     26.00 cm LA Vol (A2C):   81.5 ml 32.20 ml/m  RA Volume:   76.10 ml  30.06 ml/m LA Vol (A4C):   77.8 ml 30.74  ml/m LA Biplane Vol: 82.7 ml 32.67 ml/m AORTIC VALVE LVOT Vmax:   124.00 cm/s LVOT Vmean:  77.400 cm/s LVOT VTI:    0.269 m  AORTA Ao Root diam: 3.70 cm Ao Asc diam:  4.10 cm  MITRAL VALVE                TRICUSPID VALVE MV Area (PHT): 3.21 cm     TR Peak grad:   16.8 mmHg MV Decel Time: 236 msec     TR Vmax:        205.00 cm/s MV E velocity: 117.00 cm/s MV A velocity: 100.00 cm/s  SHUNTS MV E/A ratio:  1.17         Systemic VTI:  0.27 m Systemic Diam: 2.20 cm  Chilton Si MD Electronically signed by Chilton Si MD Signature Date/Time: 08/01/2021/10:43:01 AM    Final             EKG Interpretation Date/Time:  Thursday November 07 2022 10:03:21 EDT Ventricular Rate:  70 PR Interval:  368 QRS Duration:  124 QT Interval:  380 QTC Calculation: 410 R Axis:   -37  Text Interpretation: Sinus rhythm with 1st degree A-V block with Premature atrial complexes with Abberant conduction Left axis deviation Non-specific intra-ventricular conduction delay Confirmed by Norman Herrlich ([id number]) on 11/07/2022 10:27:14 AM   Recent Labs: No results found for requested labs within last 365 days.  Recent Lipid Panel    Component Value Date/Time   CHOL 120 08/02/2021 0136   TRIG 136 08/02/2021 0136   HDL 25 (L) 08/02/2021 0136   CHOLHDL 4.8 08/02/2021 0136   VLDL 27 08/02/2021 0136   LDLCALC 68 08/02/2021 0136    Physical Exam:    VS:  BP 108/64   Pulse 70   Ht 6' (1.829 m)   Wt 278 lb 9.6 oz (126.4 kg)   SpO2 95%   BMI 37.78 kg/m     Wt Readings from Last 3 Encounters:  11/07/22 278 lb 9.6 oz (126.4 kg)  09/25/22 282 lb (127.9 kg)  07/16/22 288 lb (130.6 kg)     GEN:  Well nourished, well developed in no acute distress HEENT: Normal NECK: No JVD; No carotid bruits LYMPHATICS: No lymphadenopathy CARDIAC: RRR, no murmurs, rubs, gallops RESPIRATORY:  Clear to auscultation without rales, wheezing or rhonchi  ABDOMEN: Soft, non-tender,  non-distended MUSCULOSKELETAL:  No edema; No deformity  SKIN: Warm and dry NEUROLOGIC:  Alert and oriented x 3 PSYCHIATRIC:  Normal affect    Signed, Norman Herrlich, MD  11/07/2022 11:49 AM    [facility]

## 2022-11-07 ENCOUNTER — Encounter: Payer: Self-pay | Admitting: Cardiology

## 2022-11-07 ENCOUNTER — Other Ambulatory Visit: Payer: Self-pay

## 2022-11-07 ENCOUNTER — Ambulatory Visit: Payer: Medicare HMO | Attending: Cardiology | Admitting: Cardiology

## 2022-11-07 VITALS — BP 108/64 | HR 70 | Ht 72.0 in | Wt 278.6 lb

## 2022-11-07 DIAGNOSIS — E782 Mixed hyperlipidemia: Secondary | ICD-10-CM | POA: Diagnosis not present

## 2022-11-07 DIAGNOSIS — I25118 Atherosclerotic heart disease of native coronary artery with other forms of angina pectoris: Secondary | ICD-10-CM

## 2022-11-07 DIAGNOSIS — N183 Chronic kidney disease, stage 3 unspecified: Secondary | ICD-10-CM

## 2022-11-07 DIAGNOSIS — E119 Type 2 diabetes mellitus without complications: Secondary | ICD-10-CM | POA: Diagnosis not present

## 2022-11-07 DIAGNOSIS — I6521 Occlusion and stenosis of right carotid artery: Secondary | ICD-10-CM

## 2022-11-07 DIAGNOSIS — N529 Male erectile dysfunction, unspecified: Secondary | ICD-10-CM

## 2022-11-07 DIAGNOSIS — Z794 Long term (current) use of insulin: Secondary | ICD-10-CM

## 2022-11-07 MED ORDER — DILTIAZEM HCL ER COATED BEADS 240 MG PO CP24: 240.0000 mg | ORAL_CAPSULE | Freq: Every day | ORAL | 3 refills | Status: DC

## 2022-11-07 NOTE — Patient Instructions (Signed)
Medication Instructions:  Your physician has recommended you make the following change in your medication:   STOP: Metoprolol STOP: Amlodipine START: Cardizem CD 240 mg daily  *If you need a refill on your cardiac medications before your next appointment, please call your pharmacy*   Lab Work: None If you have labs (blood work) drawn today and your tests are completely normal, you will receive your results only by: MyChart Message (if you have MyChart) OR A paper copy in the mail If you have any lab test that is abnormal or we need to change your treatment, we will call you to review the results.   Testing/Procedures: None   Follow-Up: At [facility], you and your health needs are our priority.  As part of our continuing mission to provide you with exceptional heart care, we have created designated Provider Care Teams.  These Care Teams include your primary Cardiologist (physician) and Advanced Practice Providers (APPs -  Physician Assistants and Nurse Practitioners) who all work together to provide you with the care you need, when you need it.  We recommend signing up for the patient portal called "MyChart".  Sign up information is provided on this After Visit Summary.  MyChart is used to connect with patients for Virtual Visits (Telemedicine).  Patients are able to view lab/test results, encounter notes, upcoming appointments, etc.  Non-urgent messages can be sent to your provider as well.   To learn more about what you can do with MyChart, go to [email or web address].    Your next appointment:   6 month(s)  Provider:   Norman Herrlich, MD    Other Instructions None

## 2022-11-08 ENCOUNTER — Other Ambulatory Visit: Payer: Self-pay | Admitting: Cardiology

## 2022-12-02 ENCOUNTER — Telehealth: Payer: Self-pay

## 2022-12-02 NOTE — Telephone Encounter (Signed)
I spoke to Mr Cayson and he is aware that his patient assistance the Novolog Flexpens # 3 boxes and Ozempic #4 boxes are here and ready to be picked up

## 2022-12-04 NOTE — Telephone Encounter (Signed)
patient assistance picked up today Novolog Flexpens # 3 boxes and Ozempic #4

## 2022-12-16 ENCOUNTER — Other Ambulatory Visit (HOSPITAL_COMMUNITY): Payer: Self-pay

## 2023-01-21 ENCOUNTER — Ambulatory Visit: Payer: Medicare HMO | Admitting: Internal Medicine

## 2023-01-21 NOTE — Progress Notes (Deleted)
Name: Mario Green  MRN/ DOB: [id number], 1955/10/09   Age/ Sex: 67 y.o., male    PCP: Simone Curia, MD   Reason for Endocrinology Evaluation: Type 2 Diabetes Mellitus     Date of Initial Endocrinology Visit: 06/22/2021    PATIENT IDENTIFIER: Mario Green is a 67 y.o. male with a past medical history of CAD, HTN, T2DM, CKD and depression . The patient presented for initial endocrinology clinic visit on 06/22/2021 for consultative assistance with his diabetes management.    HPI: Mario Green was    Diagnosed with DM years ago  Prior Medications tried/Intolerance: Metformin Hemoglobin A1c peaking at 8.0% in 04/2021.   Nephrology : Dr. Kendell Bane  Father with hx of DM    On his initial visit to our clinic his A1c was 7.6%  , he was on Levemir, Trulicity and Glimepiride. We switched Glimepiride to Novolog and continued basal insulin and Trulicity   He was started on Farxiga in August 2023 by PCP    SUBJECTIVE:   During last visit (07/16/2022): A1c 6.5%     Today (01/21/23):  Mario Green is here for a follow up on diabetes management.   He continues to follow-up with cardiology for history of carotid endarterectomy 07/2021 He continues to follow-up with nephrology Dr. Estill Bakes   Denies nausea, vomiting or diarrhea    HOME DIABETES REGIMEN: Novolog 10 units with breakfast Ozempic 2 mg weekly - Pt assistance  Tresiba  36 units daily - pt assistance  Correction factor: NovoLog (BG -130/30)     Statin: No, pt on Repatha  ACE-I/ARB: no Prior Diabetic Education: yes   CONTINUOUS GLUCOSE MONITORING RECORD INTERPRETATION    Dates of Recording: 3/24-07/15/2021  Sensor description:freestyle libre   Results statistics:   CGM use % of time 41  Average and SD 124/20.2  Time in range   97     %  % Time Above 180 3  % Time above 250 0  % Time Below target 0   Glycemic patterns summary: Bg's are optimal during  the day and night   Hyperglycemic episodes   rare  Hypoglycemic episodes occurred  rare  Overnight periods: stable      DIABETIC COMPLICATIONS: Microvascular complications:  CKD IV Denies: retinopathy, neuropathy  Last eye exam: Completed 05/20/2022  Macrovascular complications:  CAD, carotid artery disease Denies: PVD, CVA   PAST HISTORY: Past Medical History:  Past Medical History:  Diagnosis Date   Acute respiratory failure (HCC)    Allergic rhinitis    Anemia    Benign essential hypertension    CAD (coronary artery disease)    Carotid artery stenosis 07/31/2021   Carotid stenosis 07/31/2021   CKD (chronic kidney disease), symptom management only, stage 3 (moderate) (HCC)    Diabetes mellitus (HCC) 06/22/2021   Dyslipidemia 03/24/2015   ED (erectile dysfunction)    Hematoma of neck    Hyperlipidemia    Morbid obesity due to excess calories (HCC) 03/24/2015   Obstructive sleep apnea 03/24/2015   patient denied having a sleep study done   Renal insufficiency    Second degree atrioventricular block, Mobitz (type) I 07/17/2021   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 LIMA to LAD, SVG to diagonal 1, SVG to PDA   Tinnitus, bilateral    Type 2 diabetes mellitus with stage 4 chronic kidney disease, with long-term current use of insulin (HCC) 06/22/2021  Type 2 diabetes mellitus without complication (HCC) 03/24/2015   Past Surgical History:  Past Surgical History:  Procedure Laterality Date   ADENOIDECTOMY     CARDIAC CATHETERIZATION     CARDIAC SURGERY     Tripple bypass   COLONOSCOPY  05/25/2012   Colonic polyps status post polypectomy Mild sigmoid diverticulosis. Small internal hemorrhoids. Otherwise grossly normal colonoscopy. The colonoscopy was limited due to quality of preparation.   ENDARTERECTOMY Right 07/31/2021   Procedure: Hematoma Evacuation Right Post Carotid Endarterectomy;  Surgeon: Maeola Harman, MD;   Location: [facility] OR;  Service: Vascular;  Laterality: Right;   ENDARTERECTOMY Right 07/31/2021   Procedure: RIGHT ENDARTERECTOMY CAROTID WITH PATCH ANGIOPLASTY;  Surgeon: Maeola Harman, MD;  Location: [facility] OR;  Service: Vascular;  Laterality: Right;    Social History:  reports that he has never smoked. He has never been exposed to tobacco smoke. He has quit using smokeless tobacco.  His smokeless tobacco use included chew. He reports that he does not currently use alcohol. He reports that he does not use drugs. Family History:  Family History  Problem Relation Age of Onset   Dementia Mother    Diabetes Father    Heart attack Father      HOME MEDICATIONS: Allergies as of 01/21/2023   No Known Allergies      Medication List        Accurate as of January 21, 2023  7:21 AM. If you have any questions, ask your nurse or doctor.          clopidogrel 75 MG tablet Commonly known as: PLAVIX TAKE 1 TABLET BY MOUTH DAILY   diltiazem 240 MG 24 hr capsule Commonly known as: CARDIZEM CD Take 1 capsule (240 mg total) by mouth daily.   Evolocumab 140 MG/ML [place] Inject 140 mg into the skin every 14 (fourteen) days. Inject 1 pen every 14 days subcutaneously   Farxiga 5 MG Tabs tablet Generic drug: dapagliflozin propanediol Take 1 tablet by mouth daily.   Fish Oil 1000 MG Caps Take 2,000 mg by mouth every morning.   insulin detemir 100 UNIT/ML FlexPen Commonly known as: LEVEMIR Inject 40 Units into the skin daily.   Insulin Pen Needle 29G X Misc 1 Device by Does not apply route in the morning, at noon, in the evening, and at bedtime.   NovoLOG FlexPen 100 UNIT/ML FlexPen Generic drug: insulin aspart Max daily 50 units   OZEMPIC (2 MG/DOSE) [place] Inject 2 mg into the skin once a week.   tamsulosin 0.4 MG Caps capsule Commonly known as: FLOMAX Take 0.4 mg by mouth at bedtime.         ALLERGIES: No Known Allergies   REVIEW OF SYSTEMS: A comprehensive ROS  was conducted with the patient and is negative except as per HPI     OBJECTIVE:   VITAL SIGNS: There were no vitals taken for this visit.   PHYSICAL EXAM:  General: Pt appears well and is in NAD  Neck: General: Supple without adenopathy or carotid bruits. Thyroid: Thyroid size normal.  No goiter or nodules appreciated.  Lungs: Clear with good BS bilat with no rales, rhonchi, or wheezes  Heart: RRR   Neuro: MS is good with appropriate affect, pt is alert and Ox3    DM Foot Exam 07/16/2022 The skin of the feet is intact without sores or ulcerations. The pedal pulses are 2+ on right and 2+ on left. The sensation is intact to a screening 5.07,  10 gram monofilament bilaterally    DATA REVIEWED:   ASSESSMENT / PLAN / RECOMMENDATIONS:   1) Type 2 Diabetes Mellitus, Optimally ptimally controlled, With CKD IV and macrovascular  complications - Most recent A1c of 6.5%. Goal A1c < 7.0 %.    -A1c at goal -Limited glycemic agents due to CKD III- IV -Patient is on patient assistance program for Levemir, Novolog and Trulicity  -To simplify as patient assistance process, we have opted to switch Trulicity to Ozempic that way he can have 1 application for his insulin and Trulicity -Levemir is going to be discontinued by the manufacturer, will fill the application for [place] -I will decrease his Levemir/Tresiba as below -He has been taking NovoLog once daily with breakfast only, he was encouraged to continue to use correction scale for NovoLog before each meal  MEDICATIONS: Continue NovoLog 10 units with breakfast Switch Levemir to [place] and take 36 units daily Continue correction factor: NovoLog (BG -130/30) Switch Trulicity to Ozempic 2 mg weekly  EDUCATION / INSTRUCTIONS: BG monitoring instructions: Patient is instructed to check his blood sugars 3 times a day, before meals. Call [facility] if: BG persistently < 70 I reviewed the Rule of 15 for the treatment of  hypoglycemia in detail with the patient. Literature supplied.   2) Diabetic complications:  Eye: Does not have known diabetic retinopathy.  Neuro/ Feet: Does not have known diabetic peripheral neuropathy. Renal: Patient does  have known baseline CKD. He is  on an ACEI/ARB at present.    3)CAD/Dyslipidemia/carotid artery disease:   -Per cardiology -Patient on Repatha    Follow-up in 6 months    Signed electronically by: Lyndle Herrlich, MD  [facility] Endocrinology  [facility] [address] [place] [place], [place] [address] Phone: [phone] FAX: [phone]   CC: Simone Curia, MD [address] [place] [address] Phone: [phone]  Fax: [phone]    Return to Endocrinology clinic as below: Future Appointments  Date Time Provider Department Center  01/21/2023 11:10 AM Ahyan Kreeger, Konrad Dolores, MD [facility] None  02/05/2023  4:00 PM Lynann Bologna, MD [facility]

## 2023-02-04 ENCOUNTER — Telehealth: Payer: Self-pay

## 2023-02-04 ENCOUNTER — Encounter: Payer: Self-pay | Admitting: Gastroenterology

## 2023-02-04 ENCOUNTER — Ambulatory Visit: Payer: Medicare PPO | Admitting: Gastroenterology

## 2023-02-04 ENCOUNTER — Ambulatory Visit (INDEPENDENT_AMBULATORY_CARE_PROVIDER_SITE_OTHER): Payer: Medicare PPO | Admitting: Internal Medicine

## 2023-02-04 ENCOUNTER — Encounter: Payer: Self-pay | Admitting: Internal Medicine

## 2023-02-04 VITALS — BP 126/80 | HR 60 | Ht 72.0 in | Wt 292.8 lb

## 2023-02-04 VITALS — BP 118/70 | HR 51 | Resp 20 | Ht 72.0 in | Wt 292.6 lb

## 2023-02-04 DIAGNOSIS — F419 Anxiety disorder, unspecified: Secondary | ICD-10-CM

## 2023-02-04 DIAGNOSIS — N183 Chronic kidney disease, stage 3 unspecified: Secondary | ICD-10-CM

## 2023-02-04 DIAGNOSIS — I25118 Atherosclerotic heart disease of native coronary artery with other forms of angina pectoris: Secondary | ICD-10-CM

## 2023-02-04 DIAGNOSIS — Z860100 Personal history of colon polyps, unspecified: Secondary | ICD-10-CM

## 2023-02-04 DIAGNOSIS — Z7985 Long-term (current) use of injectable non-insulin antidiabetic drugs: Secondary | ICD-10-CM

## 2023-02-04 DIAGNOSIS — E1159 Type 2 diabetes mellitus with other circulatory complications: Secondary | ICD-10-CM

## 2023-02-04 DIAGNOSIS — R739 Hyperglycemia, unspecified: Secondary | ICD-10-CM

## 2023-02-04 DIAGNOSIS — Z794 Long term (current) use of insulin: Secondary | ICD-10-CM

## 2023-02-04 DIAGNOSIS — G4733 Obstructive sleep apnea (adult) (pediatric): Secondary | ICD-10-CM | POA: Diagnosis not present

## 2023-02-04 DIAGNOSIS — E1122 Type 2 diabetes mellitus with diabetic chronic kidney disease: Secondary | ICD-10-CM

## 2023-02-04 LAB — POCT GLYCOSYLATED HEMOGLOBIN (HGB A1C): Hemoglobin A1C: 6.9 % — AB (ref 4.0–5.6)

## 2023-02-04 MED ORDER — PEG 3350-KCL-NA BICARB-NACL 420 G PO SOLR: 4000.0000 mL | Freq: Once | ORAL | 0 refills | Status: AC

## 2023-02-04 NOTE — Progress Notes (Unsigned)
Name: Mario Green  MRN/ DOB: [id number], [birth year]   Age/ Sex: 67 y.o., male    PCP: Simone Curia, MD   Reason for Endocrinology Evaluation: Type 2 Diabetes Mellitus     Date of Initial Endocrinology Visit: 06/22/2021    PATIENT IDENTIFIER: Mario Green is a 67 y.o. male with a past medical history of CAD, HTN, T2DM, CKD and depression . The patient presented for initial endocrinology clinic visit on 06/22/2021 for consultative assistance with his diabetes management.    HPI: Mario Green was    Diagnosed with DM years ago  Prior Medications tried/Intolerance: Metformin Hemoglobin A1c peaking at 8.0% in 04/2021.   Nephrology : Dr. Kendell Bane  Father with hx of DM    On his initial visit to our clinic his A1c was 7.6%  , he was on Levemir, Trulicity and Glimepiride. We switched Glimepiride to Novolog and continued basal insulin and Trulicity   He was started on Farxiga in August 2023 by PCP    SUBJECTIVE:   During last visit (07/16/2022): A1c 6.5%     Today (02/04/23):  Mario Green is here for a follow up on diabetes management.  He checks his glucose multiple times a day through freestyle libre, patient has been noted with hypoglycemi at variable times.   He continues to follow-up with nephrology Dr. Estill Bakes   Denies nausea, vomiting  Denies or diarrhea    HOME DIABETES REGIMEN: Novolog 10 units with breakfast, takes 5-10 after supper Ozempic 2 mg weekly - Pt assistance  Tresiba 40  units daily - pt assistance  Correction factor: NovoLog (BG -130/30)     Statin: No, pt on Repatha  ACE-I/ARB: no Prior Diabetic Education: yes   CONTINUOUS GLUCOSE MONITORING RECORD INTERPRETATION    Dates of Recording: 8/1-10/29/2024  Sensor description:freestyle libre   Results statistics:   CGM use % of time 81  Average and SD 155/29.4  Time in range  75   %  % Time Above 180 22  % Time above 250 3  % Time Below target 0   Glycemic patterns summary:  Bg's are optimal  at night, and fluctuate during the day Hyperglycemic episodes postprandial  Hypoglycemic episodes occurred after bolus  Overnight periods: Tight     DIABETIC COMPLICATIONS: Microvascular complications:  CKD IV Denies: retinopathy, neuropathy  Last eye exam: Completed 05/20/2022  Macrovascular complications:  CAD, carotid artery disease Denies: PVD, CVA   PAST HISTORY: Past Medical History:  Past Medical History:  Diagnosis Date   Acute respiratory failure (HCC)    Allergic rhinitis    Anemia    Benign essential hypertension    CAD (coronary artery disease)    Carotid artery stenosis 07/31/2021   Carotid stenosis 07/31/2021   CKD (chronic kidney disease), symptom management only, stage 3 (moderate) (HCC)    Diabetes mellitus (HCC) 06/22/2021   Dyslipidemia 03/24/2015   ED (erectile dysfunction)    Hematoma of neck    Hyperlipidemia    Morbid obesity due to excess calories (HCC) 03/24/2015   Obstructive sleep apnea 03/24/2015   patient denied having a sleep study done   Renal insufficiency    Second degree atrioventricular block, Mobitz (type) I 07/17/2021   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 LIMA to LAD, SVG to diagonal 1, SVG to PDA   Tinnitus, bilateral    Type 2 diabetes mellitus with stage 4 chronic kidney  disease, with long-term current use of insulin (HCC) 06/22/2021   Type 2 diabetes mellitus without complication (HCC) 03/24/2015   Past Surgical History:  Past Surgical History:  Procedure Laterality Date   ADENOIDECTOMY     CARDIAC CATHETERIZATION     CARDIAC SURGERY     Tripple bypass   COLONOSCOPY  05/25/2012   Colonic polyps status post polypectomy Mild sigmoid diverticulosis. Small internal hemorrhoids. Otherwise grossly normal colonoscopy. The colonoscopy was limited due to quality of preparation.   ENDARTERECTOMY Right 07/31/2021   Procedure:  Hematoma Evacuation Right Post Carotid Endarterectomy;  Surgeon: Maeola Harman, MD;  Location: [facility] OR;  Service: Vascular;  Laterality: Right;   ENDARTERECTOMY Right 07/31/2021   Procedure: RIGHT ENDARTERECTOMY CAROTID WITH PATCH ANGIOPLASTY;  Surgeon: Maeola Harman, MD;  Location: [facility] OR;  Service: Vascular;  Laterality: Right;    Social History:  reports that he has never smoked. He has never been exposed to tobacco smoke. He has quit using smokeless tobacco.  His smokeless tobacco use included chew. He reports that he does not currently use alcohol. He reports that he does not use drugs. Family History:  Family History  Problem Relation Age of Onset   Dementia Mother    Diabetes Father    Heart attack Father    Colon cancer Neg Hx    Esophageal cancer Neg Hx    Stomach cancer Neg Hx    Colon polyps Neg Hx      HOME MEDICATIONS: Allergies as of 02/04/2023   No Known Allergies      Medication List        Accurate as of February 04, 2023 11:32 AM. If you have any questions, ask your nurse or doctor.          clopidogrel 75 MG tablet Commonly known as: PLAVIX TAKE 1 TABLET BY MOUTH DAILY   diltiazem 240 MG 24 hr capsule Commonly known as: CARDIZEM CD Take 1 capsule (240 mg total) by mouth daily.   Evolocumab 140 MG/ML Soaj Inject 140 mg into the skin every 14 (fourteen) days. Inject 1 pen every 14 days subcutaneously   Farxiga 5 MG Tabs tablet Generic drug: dapagliflozin propanediol Take 1 tablet by mouth daily.   Fish Oil 1000 MG Caps Take 2,000 mg by mouth every morning.   insulin detemir 100 UNIT/ML FlexPen Commonly known as: LEVEMIR Inject 40 Units into the skin daily.   Insulin Pen Needle 29G X Misc 1 Device by Does not apply route in the morning, at noon, in the evening, and at bedtime.   mupirocin ointment 2 % Commonly known as: BACTROBAN Apply 1 Application topically 3 (three) times daily.   NovoLOG FlexPen 100 UNIT/ML  FlexPen Generic drug: insulin aspart Max daily 50 units   OZEMPIC (2 MG/DOSE) [place] Inject 2 mg into the skin once a week.   polyethylene glycol-electrolytes 420 g solution Commonly known as: NuLYTELY Take 4,000 mLs by mouth once for 1 dose. For colonoscopy prep Started by: Lynann Bologna   tamsulosin 0.4 MG Caps capsule Commonly known as: FLOMAX Take 0.4 mg by mouth at bedtime.         ALLERGIES: No Known Allergies   REVIEW OF SYSTEMS: A comprehensive ROS was conducted with the patient and is negative except as per HPI     OBJECTIVE:   VITAL SIGNS: BP 118/70 (BP Location: Right Arm, Patient Position: Sitting, Cuff Size: Large)   Pulse (!) 51   Resp 20   Ht  6' (1.829 m)   Wt 292 lb 9.6 oz (132.7 kg)   SpO2 95%   BMI 39.68 kg/m    PHYSICAL EXAM:  General: Pt appears well and is in NAD  Lungs: Clear with good BS bilat   Heart: RRR   Neuro: MS is good with appropriate affect, pt is alert and Ox3    DM Foot Exam 07/16/2022 The skin of the feet is intact without sores or ulcerations. The pedal pulses are 2+ on right and 2+ on left. The sensation is intact to a screening 5.07, 10 gram monofilament bilaterally    DATA REVIEWED:   12/04/2022 BUN 48 Cr. 2.370 GFR 30   Old records , labs and images have been reviewed.   ASSESSMENT / PLAN / RECOMMENDATIONS:   1) Type 2 Diabetes Mellitus, Optimally ptimally controlled, With CKD III and macrovascular  complications - Most recent A1c of 6.5%. Goal A1c < 7.0 %.    -A1c at goal -Limited glycemic agents due to CKD III- IV -Patient is on patient assistance program for tresiba , Novolog and Ozempic  -He has been taking NovoLog once daily with breakfast only, but appears that he will need NovoLog with supper, I will decrease his morning dose of NovoLog due to tight/low BG's as below  MEDICATIONS: Continue Ozempic 2 mg weekly Take NovoLog 8 units with breakfast and 8 units with supper Decrease Tresiba and take 38 units  daily Continue correction factor: NovoLog (BG -130/30)   EDUCATION / INSTRUCTIONS: BG monitoring instructions: Patient is instructed to check his blood sugars 3 times a day, before meals. Call [facility] if: BG persistently < 70 I reviewed the Rule of 15 for the treatment of hypoglycemia in detail with the patient. Literature supplied.   2) Diabetic complications:  Eye: Does not have known diabetic retinopathy.  Neuro/ Feet: Does not have known diabetic peripheral neuropathy. Renal: Patient does  have known baseline CKD. He is  on an ACEI/ARB at present.    3)CAD/Dyslipidemia/carotid artery disease:   -Per cardiology -Patient on Repatha    Follow-up in 6 months    Signed electronically by: Lyndle Herrlich, MD  [facility]  [facility] [address] [place] [place], [place] [address] Phone: [phone] FAX: [phone]   CC: Simone Curia, MD [address] [place] [address] Phone: [phone]  Fax: [phone]    Return to Endocrinology clinic as below: Future Appointments  Date Time Provider Department Center  03/25/2023 11:30 AM Lynann Bologna, MD [facility]

## 2023-02-04 NOTE — Patient Instructions (Addendum)
-   Continue Ozempic 2 mg once weekly  - Take  Novolog 8 units with Breakfast and 8 units with Supper  - Decrease  Tresiba  38 units ONCE daily  - Novolog correctional insulin: ADD extra units on insulin to your meal-time Novolog dose if your blood sugars are higher than 160. Use the scale below to help guide you:   Blood sugar before meal Number of units to inject  Less than 160 0 unit  161 -  190 1 units  191 -  220 2 units  221 -  250 3 units  251 -  280 4 units  281 -  310 5 units  311 -  340 6 units  341 -  370 7 units  371 -  400 8 units     HOW TO TREAT LOW BLOOD SUGARS (Blood sugar LESS THAN 70 MG/DL) Please follow the RULE OF 15 for the treatment of hypoglycemia treatment (when your (blood sugars are less than 70 mg/dL)   STEP 1: Take 15 grams of carbohydrates when your blood sugar is low, which includes:  3-4 GLUCOSE TABS  OR 3-4 OZ OF JUICE OR REGULAR SODA OR ONE TUBE OF GLUCOSE GEL    STEP 2: RECHECK blood sugar in 15 MINUTES STEP 3: If your blood sugar is still low at the 15 minute recheck --> then, go back to STEP 1 and treat AGAIN with another 15 grams of carbohydrates.

## 2023-02-04 NOTE — Telephone Encounter (Signed)
Patient Name: Mario Green  DOB: 1955/06/21 MRN: [id number]  Primary Cardiologist: Norman Herrlich, MD  Chart reviewed as part of pre-operative protocol coverage.   Patient can hold Plavix 5 days prior to procedure and should restart postprocedure when surgically safe and guided by surgical team.   Napoleon Form, Leodis Rains, NP 02/04/2023, 10:32 AM

## 2023-02-04 NOTE — Progress Notes (Signed)
Chief Complaint: For colonoscopy  Referring Provider:  Simone Curia, MD      ASSESSMENT AND PLAN;   #1. H/O polyps  #2. Assoc CAD, DM2, CKD, OSA, anxiety   Plan: -Colon with 2 day prep (4 tabs dulcolax miralax to golyte). Hold plavix 5 days before and ozempic x 7 days prior   Discussed risks & benefits of colonoscopy. Risks including rare perforation req laparotomy, bleeding after bx/polypectomy req blood transfusion, rarely missing neoplasms, risks of anesthesia/sedation, rare risk of damage to internal organs. Benefits outweigh the risks. Patient agrees to proceed. All the questions were answered. Pt consents to proceed. HPI:    Mario Green is a 67 y.o. male  With CAD (Nl EF 55-65%), PAD on Plavix, DM2, CKD3 with anemia of chronic disease, OSA, anxiety and other multiple medical problems as below Has been losing weight on Ozempic  Has history of constipation.  Now moving his bowels 1/day No melena or hematochezia. Would occasionally have sensation of incomplete evacuation. Denies having any fever chills night sweats.  No N/V/heartburn  Seen by Dr. Dulce Sellar 11/2022-doing well from cardiac standpoint  He is already scheduled for colonoscopy  Wt Readings from Last 3 Encounters:  02/04/23 292 lb 12.8 oz (132.8 kg)  11/07/22 278 lb 9.6 oz (126.4 kg)  09/25/22 282 lb (127.9 kg)   Discussed compliance  Past GI WU: Colonoscopy 05/25/2012 (CF-redundant colon, fair prep) -1.5 cm sessile polyp in the proximal ascending colon s/p snare polypectomy using a piecemeal technique. -1.2 cm pedunculated polyp in the proximal transverse colon s/p polypectomy -Few small diverticula in the sigmoid colon -Bx: TA.  Recommended to repeat in 1 year.  Sent letters but he never came  Past Medical History:  Diagnosis Date   Acute respiratory failure (HCC)    Allergic rhinitis    Anemia    Benign essential hypertension    CAD (coronary artery disease)    Carotid artery stenosis  07/31/2021   Carotid stenosis 07/31/2021   CKD (chronic kidney disease), symptom management only, stage 3 (moderate) (HCC)    Diabetes mellitus (HCC) 06/22/2021   Dyslipidemia 03/24/2015   ED (erectile dysfunction)    Hematoma of neck    Hyperlipidemia    Morbid obesity due to excess calories (HCC) 03/24/2015   Obstructive sleep apnea 03/24/2015   patient denied having a sleep study done   Renal insufficiency    Second degree atrioventricular block, Mobitz (type) I 07/17/2021   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 LIMA to LAD, SVG to diagonal 1, SVG to PDA   Tinnitus, bilateral    Type 2 diabetes mellitus with stage 4 chronic kidney disease, with long-term current use of insulin (HCC) 06/22/2021   Type 2 diabetes mellitus without complication (HCC) 03/24/2015    Past Surgical History:  Procedure Laterality Date   ADENOIDECTOMY     CARDIAC CATHETERIZATION     CARDIAC SURGERY     Tripple bypass   COLONOSCOPY  05/25/2012   Colonic polyps status post polypectomy Mild sigmoid diverticulosis. Small internal hemorrhoids. Otherwise grossly normal colonoscopy. The colonoscopy was limited due to quality of preparation.   ENDARTERECTOMY Right 07/31/2021   Procedure: Hematoma Evacuation Right Post Carotid Endarterectomy;  Surgeon: Maeola Harman, MD;  Location: [facility] OR;  Service: Vascular;  Laterality: Right;   ENDARTERECTOMY Right 07/31/2021   Procedure: RIGHT ENDARTERECTOMY CAROTID WITH PATCH ANGIOPLASTY;  Surgeon: Maeola Harman, MD;  Location: [facility] OR;  Service: Vascular;  Laterality: Right;    Family History  Problem Relation Age of Onset   Dementia Mother    Diabetes Father    Heart attack Father    Colon cancer Neg Hx    Esophageal cancer Neg Hx    Stomach cancer Neg Hx    Colon polyps Neg Hx     Social History   Tobacco Use   Smoking status: Never    Passive exposure: Never    Smokeless tobacco: Former    Types: Engineer, drilling   Vaping status: Never Used  Substance Use Topics   Alcohol use: Not Currently    Comment: occasional   Drug use: Never    Current Outpatient Medications  Medication Sig Dispense Refill   clopidogrel (PLAVIX) 75 MG tablet TAKE 1 TABLET BY MOUTH DAILY 90 tablet 3   diltiazem (CARDIZEM CD) 240 MG 24 hr capsule Take 1 capsule (240 mg total) by mouth daily. 90 capsule 3   Evolocumab 140 MG/ML SOAJ Inject 140 mg into the skin every 14 (fourteen) days. Inject 1 pen every 14 days subcutaneously 2 mL 11   FARXIGA 5 MG TABS tablet Take 1 tablet by mouth daily.     insulin aspart (NOVOLOG FLEXPEN) 100 UNIT/ML FlexPen Max daily 50 units 45 mL 4   insulin detemir (LEVEMIR) 100 UNIT/ML FlexPen Inject 40 Units into the skin daily. 45 mL 3   Insulin Pen Needle 29G X MISC 1 Device by Does not apply route in the morning, at noon, in the evening, and at bedtime. 400 each 3   mupirocin ointment (BACTROBAN) 2 % Apply 1 Application topically 3 (three) times daily.     Omega-3 Fatty Acids (FISH OIL) 1000 MG CAPS Take 2,000 mg by mouth every morning.     Semaglutide (OZEMPIC, 2 MG/DOSE, [place]) Inject 2 mg into the skin once a week.     tamsulosin (FLOMAX) 0.4 MG CAPS capsule Take 0.4 mg by mouth at bedtime.     No current facility-administered medications for this visit.    No Known Allergies  Review of Systems:  Constitutional: Denies fever, chills, diaphoresis, appetite change and fatigue.  HEENT: Denies photophobia, eye pain, redness, hearing loss, ear pain, congestion, sore throat, rhinorrhea, sneezing, mouth sores, neck pain, neck stiffness and tinnitus.   Respiratory: Denies SOB, DOE, cough, chest tightness,  and wheezing.   Cardiovascular: Denies chest pain, palpitations and leg swelling.  Genitourinary: Denies dysuria, urgency, frequency, hematuria, flank pain and difficulty urinating.  Musculoskeletal: Denies myalgias, back pain, joint  swelling, arthralgias and gait problem.  Skin: No rash.  Neurological: Denies dizziness, seizures, syncope, weakness, light-headedness, numbness and headaches.  Hematological: Denies adenopathy. Easy bruising, personal or family bleeding history  Psychiatric/Behavioral: Has anxiety or depression     Physical Exam:    BP 126/80   Pulse 60   Ht 6' (1.829 m)   Wt 292 lb 12.8 oz (132.8 kg)   SpO2 97%   BMI 39.71 kg/m  Wt Readings from Last 3 Encounters:  02/04/23 292 lb 12.8 oz (132.8 kg)  11/07/22 278 lb 9.6 oz (126.4 kg)  09/25/22 282 lb (127.9 kg)   Constitutional:  Well-developed, in no acute distress. Psychiatric: Normal mood and affect. Behavior is normal. HEENT: Pupils normal.  Conjunctivae are normal. No scleral icterus. Cardiovascular: Normal rate, regular rhythm. No edema Pulmonary/chest: Effort normal and breath sounds normal. No wheezing, rales or rhonchi. Abdominal: Soft, nondistended. Nontender. Bowel  sounds active throughout. There are no masses palpable. No hepatomegaly. Rectal: Deferred Neurological: Alert and oriented to person place and time. Skin: Skin is warm and dry. No rashes noted.  Data Reviewed: I have personally reviewed following labs and imaging studies  CBC:    Latest Ref Rng & Units 08/01/2021    3:57 AM 07/31/2021    4:51 PM 07/31/2021    9:54 AM  CBC  WBC 4.0 - 10.5 K/uL 8.8  9.0    Hemoglobin 13.0 - 17.0 g/dL 91.4  78.2    95.6  21.3   Hematocrit 39.0 - 52.0 % 33.6  34.7    34.0  35.0   Platelets 150 - 400 K/uL 166  175      CMP:    Latest Ref Rng & Units 08/02/2021    1:36 AM 08/01/2021    3:57 AM 07/31/2021    4:51 PM  CMP  Glucose 70 - 99 mg/dL 086  578  469   BUN 8 - 23 mg/dL 37  45  47   Creatinine 0.61 - 1.24 mg/dL 6.29  5.28  4.13   Sodium 135 - 145 mmol/L 143  138  138    140   Potassium 3.5 - 5.1 mmol/L 4.4  4.0  4.7    4.8   Chloride 98 - 111 mmol/L 113  109  110   CO2 22 - 32 mmol/L 25  19  20    Calcium 8.9 - 10.3  mg/dL 8.4  8.0  8.4         Edman Circle, MD 02/04/2023, 9:27 AM  Cc: Simone Curia, MD

## 2023-02-04 NOTE — Telephone Encounter (Signed)
Received message from cardiology via Epic spoke to patient advised okay to hold Plavix 5 day's before procedure. Patient verbalized understanding

## 2023-02-04 NOTE — Patient Instructions (Addendum)
You have been scheduled for a colonoscopy. Please follow written instructions given to you at your visit today.   Please pick up your prep supplies at the pharmacy within the next 1-3 days.  If you use inhalers (even only as needed), please bring them with you on the day of your procedure.  DO NOT TAKE 7 DAYS PRIOR TO TEST- Trulicity (dulaglutide) Ozempic, Wegovy (semaglutide) Mounjaro (tirzepatide) Bydureon Bcise (exanatide extended release)  DO NOT TAKE 1 DAY PRIOR TO YOUR TEST Rybelsus (semaglutide) Adlyxin (lixisenatide) Victoza (liraglutide) Byetta (exanatide)  You will be contacted by our office prior to your procedure for directions on holding your Plavix.  If you do not hear from our office 1 week prior to your scheduled procedure, please call [phone] to discuss.   We have sent the following medications to your pharmacy for you to pick up at your convenience: Golytely  _______________________________________________________  If your blood pressure at your visit was 140/90 or greater, please contact your primary care physician to follow up on this.  _______________________________________________________  If you are age 69 or older, your body mass index should be between 23-30. Your Body mass index is 39.71 kg/m. If this is out of the aforementioned range listed, please consider follow up with your Primary Care Provider.  If you are age 69 or younger, your body mass index should be between 19-25. Your Body mass index is 39.71 kg/m. If this is out of the aformentioned range listed, please consider follow up with your Primary Care Provider.   ________________________________________________________  The [facility] providers would like to encourage you to use [facility] to communicate with providers for non-urgent requests or questions.  Due to long hold times on the telephone, sending your provider a message by [facility] may be a faster and more efficient way to get a  response.  Please allow 48 business hours for a response.  Please remember that this is for non-urgent requests.  _______________________________________________________   Due to recent changes in healthcare laws, you may see the results of your imaging and laboratory studies on MyChart before your provider has had a chance to review them.  We understand that in some cases there may be results that are confusing or concerning to you. Not all laboratory results come back in the same time frame and the provider may be waiting for multiple results in order to interpret others.  Please give [place] 48 hours in order for your provider to thoroughly review all the results before contacting the office for clarification of your results.   Thank you for entrusting me with your care and choosing [facility].  Dr Chales Abrahams

## 2023-02-04 NOTE — Telephone Encounter (Signed)
 [facility] Pre-operative Risk Assessment     Request for surgical clearance:     Endoscopy Procedure  What type of surgery is being performed?     Colonoscopy  When is this surgery scheduled?     03/25/23  What type of clearance is required ?   Pharmacy  Are there any medications that need to be held prior to surgery and how long? Plavix 5 day's hold   Practice name and name of physician performing surgery?      [facility]  What is your office phone and fax number?      Phone- [phone]  Fax- [phone]  Anesthesia type (None, local, MAC, general) ?       MAC

## 2023-02-05 ENCOUNTER — Ambulatory Visit: Payer: Medicare HMO | Admitting: Gastroenterology

## 2023-02-05 ENCOUNTER — Encounter: Payer: Self-pay | Admitting: Internal Medicine

## 2023-02-07 NOTE — Telephone Encounter (Signed)
Patient came in to office today and picked up 3 boxes of patient assistance Insulin Degludec.

## 2023-02-21 ENCOUNTER — Other Ambulatory Visit: Payer: Self-pay | Admitting: Cardiology

## 2023-03-14 ENCOUNTER — Encounter: Payer: Self-pay | Admitting: Gastroenterology

## 2023-03-21 NOTE — Telephone Encounter (Signed)
Patient came in to office today and picked up 2 boxes of Ozempic and 2 boxes of Novolog.  Both were patient assistance.

## 2023-03-23 ENCOUNTER — Telehealth: Payer: Self-pay | Admitting: Physician Assistant

## 2023-03-23 NOTE — Telephone Encounter (Signed)
Telephone Call  03/23/23 10:25  Today, patient called on-call service.  Apparently he was supposed to be called 5 days prior to his scheduled colonoscopy on 12/17 to stop his Plavix but he never got that phone call.  He took Plavix yesterday.  He is just wondering if he should go ahead and drink his bowel prep today or if he needs to wait and things need to be postponed.  I explained to the patient that we will need to postpone his colonoscopy as Plavix requires a 5-day hold.  He should go ahead and take his Plavix today as per normal and not during a bowel prep.  Our nursing staff will coordinate with Dr. Chales Abrahams to figure out a new day for his colonoscopy.  He will need clear instructions as to when he should hold his Plavix in the future.  Hyacinth Meeker, PA-C  Joyce-colonoscopy will need to be rescheduled

## 2023-03-24 NOTE — Telephone Encounter (Signed)
No noted scheduled availability for Dr. Chales Abrahams at present. The [facility] schedule is not available at this time starting for March.

## 2023-03-24 NOTE — Telephone Encounter (Signed)
Patient notified and agreeable. Will notify when scheduling is available. Patient states he was notified the Plavix is supposed to be stopped 5 days prior to the colonoscopy and forgot.

## 2023-03-24 NOTE — Telephone Encounter (Signed)
Patient called stated he didn't hear from our office on went to stop his Plavix medication therefore he needs to reschedule.

## 2023-03-25 ENCOUNTER — Encounter: Payer: Medicare PPO | Admitting: Gastroenterology

## 2023-04-08 ENCOUNTER — Encounter: Payer: Self-pay | Admitting: *Deleted

## 2023-04-08 ENCOUNTER — Other Ambulatory Visit: Payer: Self-pay | Admitting: *Deleted

## 2023-04-08 ENCOUNTER — Telehealth: Payer: Self-pay | Admitting: *Deleted

## 2023-04-08 DIAGNOSIS — K5900 Constipation, unspecified: Secondary | ICD-10-CM

## 2023-04-08 NOTE — Telephone Encounter (Signed)
 [facility] Pre-operative Risk Assessment     Request for surgical clearance:     Endoscopy Procedure  What type of surgery is being performed?     colonoscopy  When is this surgery scheduled?     06/04/2023  What type of clearance is required ?   Pharmacy  Are there any medications that need to be held prior to surgery and how long? Plavix  for 5 day hold  Practice name and name of physician performing surgery?      [facility]  What is your office phone and fax number?      Phone- [phone]  Fax- [phone]  Anesthesia type (None, local, MAC, general) ?       MAC   Please route your response to Merlynn Screws, RN

## 2023-04-08 NOTE — Telephone Encounter (Signed)
   Patient Name: Mario Green  DOB: 1955-05-08 MRN: [id number]  Primary Cardiologist: Redell Leiter, MD  Chart reviewed as part of pre-operative protocol coverage. Pre-op clearance already addressed by colleagues in earlier phone notes.  -We received a pharmacological clearance request regarding the patient's Plavix .  Patient has a remote history of CABG, therefore if no recent cardiovascular symptoms can hold Plavix  x 5 days prior to procedure and resume a medic safe to do so.  Of note, patient also has history of carotid endarterectomy.  May be wise to also receive clearance from vascular surgery.  Will route this bundled recommendation to requesting provider via Epic fax function and remove from pre-op pool. Please call with questions.  Orren LOISE Fabry, PA-C 04/08/2023, 11:29 AM

## 2023-04-08 NOTE — Telephone Encounter (Signed)
 Called patient to schedule colonoscopy. Procedure scheduled with Dr. Charlanne on 06/04/2023 at 1:30p. Patient states during his last procedure, there was stool still noted in his colon and Dr. Charlanne informed him to use Mirralax and Gatorade in preparation for his next colonoscopy. Patient's last scheduled colonoscopy was cancelled on 03/25/2023 due to patient taking Plavix  while supposed to have been on hold. Dr. Charlanne clarified order for the patient to also take the Mirralax ( 4 capfuls) with 32 ounces of water due to his being a diabetic. Called the pharmacist at [facility], to also clarify the mixture of the [employer]. Patient states he was informed to mix Gatorade, informed the patient, will check to see if Gatorade will be ok to mix because he is diabetic. The pharmacist, Ozell, states the patient can also mix Crystal lite ( sugar free) with the Suprep along with water (for taste). Ozell also states if the patient is having difficulty with understanding the instructions on mixing the prep, he will gladly show him how to mix the solution or prep. Patient notified. Nurse also discussed with patient the diabetic medications that he is presently taking and how to take the medications prior to the colonoscopy. Medications including Novolog , Levemir , and Ozempic . Patient is also taking Plavix , his cardiologist will be contacted to receive medication clearance. Patient asked that the Suprep instructions be sent to this MyChart and mailed to him as well.

## 2023-04-22 ENCOUNTER — Telehealth: Payer: Self-pay | Admitting: *Deleted

## 2023-04-22 NOTE — Telephone Encounter (Signed)
 Called patient after receiving notification that patient has not read the information sent to him via MyChart for prep instructions due to scheduled colonoscopy. No answer, left message to call back.

## 2023-04-23 NOTE — Telephone Encounter (Signed)
 Called patient to insure that he received the information for the colonoscopy prep instructions. These instructions were sent out also through the mail. Patient informed the nurse that he did receive the instructions although he did not view the MyChart prep instructions.

## 2023-05-06 ENCOUNTER — Telehealth: Payer: Self-pay | Admitting: Internal Medicine

## 2023-05-06 NOTE — Telephone Encounter (Signed)
Patient dropped of application and financial docs for patient assistance application.  Place in provider box at front desk.  Additional question posed at drop off was that he had gotten a message that his Free Style Josephine Igo 2 needs to be updated to newest Free Style.  Patient also states that the Ozempic does not "feel" as effective and was wondering whether or not the dose can be adjusted to be more effective.    Call back # [phone]

## 2023-05-07 MED ORDER — FREESTYLE LIBRE 3 SENSOR MISC: 3 refills | Status: AC

## 2023-05-07 NOTE — Telephone Encounter (Signed)
Patient also states Ozempic does not "feel" as effective. Patient is currently on the highest dose of this medication and would like to know if there is something else that can be more effective at a higher dose.

## 2023-05-07 NOTE — Telephone Encounter (Signed)
Patient will stay on the Ozempic for now as he is not able to pay for the [facility] out of pocket.

## 2023-05-16 ENCOUNTER — Telehealth: Payer: Self-pay

## 2023-05-16 NOTE — Telephone Encounter (Signed)
 Patient aware that the Insulin  Degludec has arrived through [employer] and can be picked up .

## 2023-05-19 ENCOUNTER — Other Ambulatory Visit: Payer: Self-pay

## 2023-05-19 ENCOUNTER — Ambulatory Visit: Payer: Medicare Other | Attending: Cardiology

## 2023-05-19 ENCOUNTER — Other Ambulatory Visit (HOSPITAL_COMMUNITY): Payer: Self-pay

## 2023-05-19 ENCOUNTER — Telehealth: Payer: Self-pay

## 2023-05-19 ENCOUNTER — Telehealth: Payer: Self-pay | Admitting: Pharmacy Technician

## 2023-05-19 DIAGNOSIS — I441 Atrioventricular block, second degree: Secondary | ICD-10-CM

## 2023-05-19 NOTE — Telephone Encounter (Signed)
 Pharmacy Patient Advocate Encounter  Received notification from [facility] that Prior Authorization for repatha  has been APPROVED from 05/19/23 to 11/16/23. Ran test claim, Copay is $47.00 one month. This test claim was processed through [facility]- copay amounts may vary at other pharmacies due to pharmacy/plan contracts, or as the patient moves through the different stages of their insurance plan.   PA #/Case ID/Reference #: [id number]

## 2023-05-19 NOTE — Telephone Encounter (Signed)
 Pharmacy requesting prior authorization for Repatha

## 2023-05-19 NOTE — Telephone Encounter (Signed)
 Pharmacy Patient Advocate Encounter   Received notification from Pt Calls Messages that prior authorization for repatha  is required/requested.   Insurance verification completed.   The patient is insured through [employer] .   Per test claim: PA required; PA submitted to above mentioned insurance via CoverMyMeds Key/confirmation #/EOC [id number] Status is pending

## 2023-05-19 NOTE — Progress Notes (Signed)
   Nurse Visit   Date of Encounter: 05/19/2023 ID: Mario Green, DOB [birth year], MRN [id number]  PCP:  Mario Curia, MD   [name] Cardiologist:  Mario Herrlich, MD      Visit Details   VS:  BP 120/62 (BP Location: Left Arm, Patient Position: Sitting, Cuff Size: Large)   Pulse (!) 48  , BMI There is no height or weight on file to calculate BMI.  Wt Readings from Last 3 Encounters:  02/04/23 292 lb 9.6 oz (132.7 kg)  02/04/23 292 lb 12.8 oz (132.8 kg)  11/07/22 278 lb 9.6 oz (126.4 kg)     Reason for visit: Perform EKG Performed today: EKG, Vitals, Education and Provider consulted. Changes (medications, testing, etc.) : Discontinue Diltiazem and set-up a nurse visit in 3 days for EKG Length of Visit: 20 minutes    Medications Adjustments/Labs and Tests Ordered: No orders of the defined types were placed in this encounter.  No orders of the defined types were placed in this encounter.    Signed, Mario Frederic, RN  05/19/2023 1:26 PM

## 2023-05-22 ENCOUNTER — Ambulatory Visit: Payer: Medicare Other | Attending: Cardiology | Admitting: Emergency Medicine

## 2023-05-22 ENCOUNTER — Ambulatory Visit: Payer: Medicare Other

## 2023-05-22 VITALS — BP 118/68 | HR 54 | Ht 72.0 in | Wt 297.0 lb

## 2023-05-22 DIAGNOSIS — I441 Atrioventricular block, second degree: Secondary | ICD-10-CM | POA: Diagnosis not present

## 2023-05-22 NOTE — Progress Notes (Signed)
   Nurse Visit   Date of Encounter: 05/22/2023 ID: Mario Green, DOB 1955-04-12, MRN [id number]  PCP:  Simone Curia, MD   [facility] Providers Cardiologist:  Norman Herrlich, MD      Visit Details   VS:  There were no vitals taken for this visit. , BMI There is no height or weight on file to calculate BMI.  Wt Readings from Last 3 Encounters:  02/04/23 292 lb 9.6 oz (132.7 kg)  02/04/23 292 lb 12.8 oz (132.8 kg)  11/07/22 278 lb 9.6 oz (126.4 kg)     Reason for visit: Repeat EKG  Performed today: EKG and Vital signs and consulted Dr. Bing Matter  Changes (medications, testing, etc.) :  7 day zio monitor Length of Visit: 15 minutes    Medications Adjustments/Labs and Tests Ordered: Orders Placed This Encounter  Procedures   LONG TERM MONITOR (3-14 DAYS)   EKG 12-Lead   No orders of the defined types were placed in this encounter.    Signed, Lonia Farber, RN  05/22/2023 11:19 AM

## 2023-05-27 ENCOUNTER — Encounter: Payer: Self-pay | Admitting: Gastroenterology

## 2023-06-04 ENCOUNTER — Encounter: Payer: Self-pay | Admitting: Gastroenterology

## 2023-06-04 ENCOUNTER — Ambulatory Visit: Payer: Medicare Other | Admitting: Gastroenterology

## 2023-06-04 VITALS — BP 145/73 | HR 53 | Temp 97.3°F | Resp 16 | Ht 72.0 in | Wt 292.0 lb

## 2023-06-04 DIAGNOSIS — D125 Benign neoplasm of sigmoid colon: Secondary | ICD-10-CM | POA: Diagnosis not present

## 2023-06-04 DIAGNOSIS — Z1211 Encounter for screening for malignant neoplasm of colon: Secondary | ICD-10-CM | POA: Diagnosis not present

## 2023-06-04 DIAGNOSIS — K5730 Diverticulosis of large intestine without perforation or abscess without bleeding: Secondary | ICD-10-CM | POA: Diagnosis not present

## 2023-06-04 DIAGNOSIS — D123 Benign neoplasm of transverse colon: Secondary | ICD-10-CM

## 2023-06-04 DIAGNOSIS — K640 First degree hemorrhoids: Secondary | ICD-10-CM

## 2023-06-04 DIAGNOSIS — Z860100 Personal history of colon polyps, unspecified: Secondary | ICD-10-CM

## 2023-06-04 DIAGNOSIS — D214 Benign neoplasm of connective and other soft tissue of abdomen: Secondary | ICD-10-CM

## 2023-06-04 MED ORDER — SODIUM CHLORIDE 0.9 % IV SOLN
500.0000 mL | Freq: Once | INTRAVENOUS | Status: AC
Start: 2023-06-04 — End: ?

## 2023-06-04 NOTE — Progress Notes (Signed)
 Pt's states no medical or surgical changes since previsit or office visit.

## 2023-06-04 NOTE — Progress Notes (Signed)
 Called to room to assist during endoscopic procedure.  Patient ID and intended procedure confirmed with present staff. Received instructions for my participation in the procedure from the performing physician.

## 2023-06-04 NOTE — Patient Instructions (Signed)
 Resume diet and medication - start Plavix at prior dose in 3 days.  Repeat colonoscopy in 1 year at [facility] with 2 day prep.  Handouts provided on polyps, diverticula, and hemorrhoids.    YOU HAD AN ENDOSCOPIC PROCEDURE TODAY AT THE [facility]:   Refer to the procedure report that was given to you for any specific questions about what was found during the examination.  If the procedure report does not answer your questions, please call your gastroenterologist to clarify.  If you requested that your care partner not be given the details of your procedure findings, then the procedure report has been included in a sealed envelope for you to review at your convenience later.  YOU SHOULD EXPECT: Some feelings of bloating in the abdomen. Passage of more gas than usual.  Walking can help get rid of the air that was put into your GI tract during the procedure and reduce the bloating. If you had a lower endoscopy (such as a colonoscopy or flexible sigmoidoscopy) you may notice spotting of blood in your stool or on the toilet paper. If you underwent a bowel prep for your procedure, you may not have a normal bowel movement for a few days.  Please Note:  You might notice some irritation and congestion in your nose or some drainage.  This is from the oxygen used during your procedure.  There is no need for concern and it should clear up in a day or so.  SYMPTOMS TO REPORT IMMEDIATELY:  Following lower endoscopy (colonoscopy or flexible sigmoidoscopy):  Excessive amounts of blood in the stool  Significant tenderness or worsening of abdominal pains  Swelling of the abdomen that is new, acute  Fever of 100F or higher  For urgent or emergent issues, a gastroenterologist can be reached at any hour by calling [phone]. Do not use MyChart messaging for urgent concerns.    DIET:  We do recommend a small meal at first, but then you may proceed to your regular diet.  Drink plenty of fluids but  you should avoid alcoholic beverages for 24 hours.  ACTIVITY:  You should plan to take it easy for the rest of today and you should NOT DRIVE or use heavy machinery until tomorrow (because of the sedation medicines used during the test).    FOLLOW UP: Our staff will call the number listed on your records the next business day following your procedure.  We will call around 7:15- 8:00 am to check on you and address any questions or concerns that you may have regarding the information given to you following your procedure. If we do not reach you, we will leave a message.     If any biopsies were taken you will be contacted by phone or by letter within the next 1-3 weeks.  Please call us at [phone] if you have not heard about the biopsies in 3 weeks.    SIGNATURES/CONFIDENTIALITY: You and/or your care partner have signed paperwork which will be entered into your electronic medical record.  These signatures attest to the fact that that the information above on your After Visit Summary has been reviewed and is understood.  Full responsibility of the confidentiality of this discharge information lies with you and/or your care-partner.

## 2023-06-04 NOTE — Progress Notes (Signed)
 Vss nad trans to pacu

## 2023-06-04 NOTE — Op Note (Signed)
 [facility] Patient Name: Mario Green Procedure Date: 06/04/2023 1:27 PM MRN: [id number] Endoscopist: Lynann Bologna , MD, [id number] Age: 68 Referring MD:  Date of Birth: 01-14-1956 Gender: Male Account #: [id number] Procedure:                Colonoscopy Indications:              High risk colon cancer surveillance: Personal                            history of colonic polyps Medicines:                Monitored Anesthesia Care Procedure:                Pre-Anesthesia Assessment:                           - Prior to the procedure, a History and Physical                            was performed, and patient medications and                            allergies were reviewed. The patient's tolerance of                            previous anesthesia was also reviewed. The risks                            and benefits of the procedure and the sedation                            options and risks were discussed with the patient.                            All questions were answered, and informed consent                            was obtained. Prior Anticoagulants: The patient has                            taken Plavix (clopidogrel), last dose was 5 days                            prior to procedure. ASA Grade Assessment: III - A                            patient with severe systemic disease. After                            reviewing the risks and benefits, the patient was                            deemed in satisfactory condition to undergo the  procedure.                           After obtaining informed consent, the colonoscope                            was passed under direct vision. Throughout the                            procedure, the patient's blood pressure, pulse, and                            oxygen saturations were monitored continuously. The                            Olympus Scope SN: [id number] was introduced through                             the anus and advanced to the the ileocecal valve.                            Cecum not well visualized d/t stool. The                            colonoscopy was extremely difficult due to                            inadequate bowel prep, a tortuous colon and the                            patient's body habitus. Successful completion of                            the procedure was aided by applying abdominal                            pressure and lavage. The patient tolerated the                            procedure well. The quality of the bowel                            preparation was adequate to identify polyps greater                            than 5 mm in size. Retained stool in R coon. The                            ileocecal valve was photographed. Scope In: 1:46:17 PM Scope Out: 2:17:05 PM Scope Withdrawal Time: 0 hours 16 minutes 57 seconds  Total Procedure Duration: 0 hours 30 minutes 48 seconds  Findings:                 Three sessile polyps were found in the mid sigmoid  colon, distal sigmoid colon and mid transverse                            colon. The polyps were 5 to 6 mm in size. These                            polyps were removed with a cold snare. Resection                            and retrieval were complete.                           A few medium-mouthed diverticula were found in the                            sigmoid colon and ascending colon.                           Non-bleeding internal hemorrhoids were found during                            retroflexion. The hemorrhoids were small and Grade                            I (internal hemorrhoids that do not prolapse).                           The exam was otherwise without abnormality on                            direct and retroflexion views. Complications:            No immediate complications. Estimated Blood Loss:     Estimated blood loss: none. Impression:                - Three 5 to 6 mm polyps in the mid sigmoid colon,                            in the distal sigmoid colon and in the mid                            transverse colon, removed with a cold snare.                            Resected and retrieved.                           - Diverticulosis in the sigmoid colon and in the                            ascending colon.                           - Non-bleeding internal hemorrhoids.                           -  The examination was otherwise normal on direct                            and retroflexion views. Recommendation:           - Patient has a contact number available for                            emergencies. The signs and symptoms of potential                            delayed complications were discussed with the                            patient. Return to normal activities tomorrow.                            Written discharge instructions were provided to the                            patient.                           - Resume previous diet.                           - Resume Plavix (clopidogrel) at prior dose in 3                            days.                           - Await pathology results.                           - Repeat colonoscopy in 1 year for surveillance                            with 2 day prep at [facility] with longer colonoscope.                           - The findings and recommendations were discussed                            with the patient's family. Lynann Bologna, MD 06/04/2023 2:23:18 PM This report has been signed electronically.

## 2023-06-04 NOTE — Progress Notes (Signed)
 [facility] History and Physical   Primary Care Physician:  Simone Curia, MD   Reason for Procedure:   H/O polyps  Plan:    colon     HPI: Mario Green is a 68 y.o. male    Past Medical History:  Diagnosis Date   Acute respiratory failure (HCC)    Allergic rhinitis    Anemia    Benign essential hypertension    CAD (coronary artery disease)    Carotid artery stenosis 07/31/2021   Carotid stenosis 07/31/2021   CKD (chronic kidney disease), symptom management only, stage 3 (moderate) (HCC)    Diabetes mellitus (HCC) 06/22/2021   Dyslipidemia 03/24/2015   ED (erectile dysfunction)    Hematoma of neck    Hyperlipidemia    Morbid obesity due to excess calories (HCC) 03/24/2015   Obstructive sleep apnea 03/24/2015   patient denied having a sleep study done   Renal insufficiency    Second degree atrioventricular block, Mobitz (type) I 07/17/2021   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 LIMA to LAD, SVG to diagonal 1, SVG to PDA   Tinnitus, bilateral    Type 2 diabetes mellitus with stage 4 chronic kidney disease, with long-term current use of insulin (HCC) 06/22/2021   Type 2 diabetes mellitus without complication (HCC) 03/24/2015    Past Surgical History:  Procedure Laterality Date   ADENOIDECTOMY     CARDIAC CATHETERIZATION     CARDIAC SURGERY     Tripple bypass   COLONOSCOPY  05/25/2012   Colonic polyps status post polypectomy Mild sigmoid diverticulosis. Small internal hemorrhoids. Otherwise grossly normal colonoscopy. The colonoscopy was limited due to quality of preparation.   ENDARTERECTOMY Right 07/31/2021   Procedure: Hematoma Evacuation Right Post Carotid Endarterectomy;  Surgeon: Maeola Harman, MD;  Location: [facility] OR;  Service: Vascular;  Laterality: Right;   ENDARTERECTOMY Right 07/31/2021   Procedure: RIGHT ENDARTERECTOMY CAROTID WITH PATCH ANGIOPLASTY;   Surgeon: Maeola Harman, MD;  Location: [facility] OR;  Service: Vascular;  Laterality: Right;    Prior to Admission medications   Medication Sig Start Date End Date Taking? Authorizing Provider  Continuous Glucose Sensor (FREESTYLE LIBRE 3 SENSOR) MISC Place 1 sensor on the skin every 15 days. Use to check glucose continuously 05/07/23  Yes Shamleffer, Konrad Dolores, MD  FARXIGA 5 MG TABS tablet Take 1 tablet by mouth daily. 11/28/21  Yes [provider]  insulin aspart (NOVOLOG FLEXPEN) 100 UNIT/ML FlexPen Max daily 50 units 10/03/21  Yes Shamleffer, Konrad Dolores, MD  insulin detemir (LEVEMIR) 100 UNIT/ML FlexPen Inject 40 Units into the skin daily. 10/03/21  Yes Shamleffer, Konrad Dolores, MD  Insulin Pen Needle 29G X MISC 1 Device by Does not apply route in the morning, at noon, in the evening, and at bedtime. 06/22/21  Yes Shamleffer, Konrad Dolores, MD  Omega-3 Fatty Acids (FISH OIL) 1000 MG CAPS Take 2,000 mg by mouth every morning.   Yes [provider]  REPATHA SURECLICK 140 MG/ML SOAJ INJECT 140 MGS SUBCUTANEOUSLY EVERY 14 DAYS 02/21/23  Yes Baldo Daub, MD  tamsulosin (FLOMAX) 0.4 MG CAPS capsule Take 0.4 mg by mouth at bedtime. 05/07/21  Yes [provider]  clopidogrel (PLAVIX) 75 MG tablet TAKE 1 TABLET BY MOUTH DAILY 11/08/22   Baldo Daub, MD  Semaglutide (OZEMPIC, 2 MG/DOSE, [place]) Inject 2 mg into the skin once a week.    [provider]    Current Outpatient Medications  Medication Sig Dispense Refill   Continuous Glucose Sensor (FREESTYLE LIBRE 3 SENSOR) MISC Place 1 sensor on the skin every 15 days. Use to check glucose continuously 6 each 3   FARXIGA 5 MG TABS tablet Take 1 tablet by mouth daily.     insulin aspart (NOVOLOG FLEXPEN) 100 UNIT/ML FlexPen Max daily 50 units 45 mL 4   insulin detemir (LEVEMIR) 100 UNIT/ML FlexPen Inject 40 Units into the skin daily. 45 mL 3   Insulin Pen Needle 29G X MISC 1 Device by Does not  apply route in the morning, at noon, in the evening, and at bedtime. 400 each 3   Omega-3 Fatty Acids (FISH OIL) 1000 MG CAPS Take 2,000 mg by mouth every morning.     REPATHA SURECLICK 140 MG/ML SOAJ INJECT 140 MGS SUBCUTANEOUSLY EVERY 14 DAYS 2 mL 11   tamsulosin (FLOMAX) 0.4 MG CAPS capsule Take 0.4 mg by mouth at bedtime.     clopidogrel (PLAVIX) 75 MG tablet TAKE 1 TABLET BY MOUTH DAILY 90 tablet 3   Semaglutide (OZEMPIC, 2 MG/DOSE, [place]) Inject 2 mg into the skin once a week.     Current Facility-Administered Medications  Medication Dose Route Frequency Provider Last Rate Last Admin   0.9 %  sodium chloride infusion  500 mL Intravenous Once Lynann Bologna, MD        Allergies as of 06/04/2023   (No Known Allergies)    Family History  Problem Relation Age of Onset   Dementia Mother    Diabetes Father    Heart attack Father    Colon cancer Neg Hx    Esophageal cancer Neg Hx    Stomach cancer Neg Hx    Colon polyps Neg Hx     Social History   Socioeconomic History   Marital status: Single    Spouse name: Not on file   Number of children: 2   Years of education: Not on file   Highest education level: Not on file  Occupational History   Occupation: Retired  Tobacco Use   Smoking status: Never    Passive exposure: Never   Smokeless tobacco: Former    Types: Associate Professor status: Never Used  Substance and Sexual Activity   Alcohol use: Not Currently    Comment: occasional   Drug use: Never   Sexual activity: Not on file  Other Topics Concern   Not on file  Social History Narrative   Not on file   Social Drivers of Health   Financial Resource Strain: Not on file  Food Insecurity: Not on file  Transportation Needs: Not on file  Physical Activity: Not on file  Stress: Not on file  Social Connections: Not on file  Intimate Partner Violence: Not on file    Review of Systems: Positive for none All other review of systems negative except as  mentioned in the HPI.  Physical Exam: Vital signs in last 24 hours: @VSRANGES @   General:   Alert,  Well-developed, well-nourished, pleasant and cooperative in NAD Lungs:  Clear throughout to auscultation.   Heart:  Regular rate and rhythm; no murmurs, clicks, rubs,  or gallops. Abdomen:  Soft, nontender and nondistended. Normal bowel sounds.   Neuro/Psych:  Alert and cooperative. Normal mood and affect. A and O x 3    No significant changes were identified.  The patient continues to be an appropriate candidate for the planned  procedure and anesthesia.   Edman Circle, MD. [facility] 06/04/2023 4:22 PM@ H/O polyps

## 2023-06-05 ENCOUNTER — Telehealth: Payer: Self-pay

## 2023-06-05 ENCOUNTER — Telehealth: Payer: Self-pay | Admitting: Internal Medicine

## 2023-06-05 DIAGNOSIS — E1159 Type 2 diabetes mellitus with other circulatory complications: Secondary | ICD-10-CM

## 2023-06-05 MED ORDER — OZEMPIC (2 MG/DOSE) 8 MG/3ML ~~LOC~~ SOPN: 2.0000 mg | PEN_INJECTOR | SUBCUTANEOUS | 1 refills | Status: DC

## 2023-06-05 NOTE — Telephone Encounter (Signed)
 MEDICATION: Free Style Libre Sensors  PHARMACY:  [facility] - fax # [phone]  HAS THE PATIENT CONTACTED THEIR PHARMACY?  yes  IS THIS A 90 DAY SUPPLY : yes  IS PATIENT OUT OF MEDICATION: yes  IF NOT; HOW MUCH IS LEFT:   LAST APPOINTMENT DATE: @2 /10/2023  NEXT APPOINTMENT DATE:@4 /28/2025  Call back # [phone], patient advises Mario Green has sent a fax to [place] for this RX

## 2023-06-05 NOTE — Telephone Encounter (Signed)
Rx sent, my chart message sent to pt.

## 2023-06-05 NOTE — Telephone Encounter (Signed)
 Patient's girlfriend, Andrey Campanile, is calling to say that a new prescription needs to be sent in to pharmacy for  OZEMPIC (2 MG/DOSE) Menard Semaglutide (OZEMPIC, 2 MG/DOSE, [place]). Patient uses [facility] - [place], [place] - [address] (Ph: [phone]) .  Per [employer] sent him a voucher to ge\t his prescription at a local pharmacy until they can ship Asim his medication.

## 2023-06-05 NOTE — Telephone Encounter (Signed)
  Follow up Call-     06/04/2023    1:00 PM  Call back number  Post procedure Call Back phone  # [phone]  Permission to leave phone message Yes     Patient questions:  Do you have a fever, pain , or abdominal swelling? No. Pain Score  0 *  Have you tolerated food without any problems? Yes.    Have you been able to return to your normal activities? Yes.    Do you have any questions about your discharge instructions: Diet   No. Medications  No. Follow up visit  No.  Do you have questions or concerns about your Care? No.  Actions: * If pain score is 4 or above: No action needed, pain <4.

## 2023-06-06 LAB — LAB REPORT - SCANNED
Albumin, Urine POC: 14.6
Albumin/Creatinine Ratio, Urine, POC: 25
Calcium: 9.3
Creatinine, POC: 58.9 mg/dL
EGFR: 38
PTH: 69

## 2023-06-06 NOTE — Telephone Encounter (Signed)
 Patient picked up patient assistance - Log Noted

## 2023-06-06 NOTE — Telephone Encounter (Signed)
 Mario Green places up front

## 2023-06-06 NOTE — Telephone Encounter (Signed)
 Patient picked up Free Style Libre 3 Plus sample

## 2023-06-09 LAB — SURGICAL PATHOLOGY

## 2023-06-11 ENCOUNTER — Encounter: Payer: Self-pay | Admitting: Gastroenterology

## 2023-06-17 ENCOUNTER — Other Ambulatory Visit (HOSPITAL_COMMUNITY): Payer: Self-pay

## 2023-06-24 ENCOUNTER — Other Ambulatory Visit (HOSPITAL_COMMUNITY): Payer: Self-pay

## 2023-07-01 ENCOUNTER — Telehealth: Payer: Self-pay | Admitting: Cardiology

## 2023-07-01 NOTE — Telephone Encounter (Signed)
Pt calling to get heart monitor results

## 2023-07-08 ENCOUNTER — Telehealth: Payer: Self-pay

## 2023-07-08 NOTE — Telephone Encounter (Signed)
 Left message on My Chart with Monitor results per Dr. Vanetta Shawl note. Routed to PCP.

## 2023-07-09 ENCOUNTER — Telehealth: Payer: Self-pay

## 2023-07-09 NOTE — Telephone Encounter (Signed)
 Pt viewed monitor results on My Chart per Dr. Vanetta Shawl note. Routed to PCP.

## 2023-08-04 ENCOUNTER — Ambulatory Visit: Payer: Medicare PPO | Admitting: Internal Medicine

## 2023-08-04 ENCOUNTER — Encounter: Payer: Self-pay | Admitting: Internal Medicine

## 2023-08-04 VITALS — BP 126/80 | HR 61 | Ht 72.0 in | Wt 299.0 lb

## 2023-08-04 DIAGNOSIS — E1122 Type 2 diabetes mellitus with diabetic chronic kidney disease: Secondary | ICD-10-CM

## 2023-08-04 DIAGNOSIS — E1159 Type 2 diabetes mellitus with other circulatory complications: Secondary | ICD-10-CM | POA: Diagnosis not present

## 2023-08-04 DIAGNOSIS — E1142 Type 2 diabetes mellitus with diabetic polyneuropathy: Secondary | ICD-10-CM

## 2023-08-04 DIAGNOSIS — N1832 Chronic kidney disease, stage 3b: Secondary | ICD-10-CM

## 2023-08-04 DIAGNOSIS — Z794 Long term (current) use of insulin: Secondary | ICD-10-CM

## 2023-08-04 LAB — POCT GLYCOSYLATED HEMOGLOBIN (HGB A1C): Hemoglobin A1C: 6.9 % — AB (ref 4.0–5.6)

## 2023-08-04 NOTE — Patient Instructions (Addendum)
-   Continue Ozempic  2 mg once weekly  - Take Novolog  6 units with Breakfast and 5 units with Supper  - Continue Tresiba  38 units ONCE daily  - Novolog  correctional insulin : ADD extra units on insulin  to your meal-time Novolog  dose if your blood sugars are higher than 160. Use the scale below to help guide you:   Blood sugar before meal Number of units to inject  Less than 160 0 unit  161 -  190 1 units  191 -  220 2 units  221 -  250 3 units  251 -  280 4 units  281 -  310 5 units  311 -  340 6 units  341 -  370 7 units  371 -  400 8 units     HOW TO TREAT LOW BLOOD SUGARS (Blood sugar LESS THAN 70 MG/DL) Please follow the RULE OF 15 for the treatment of hypoglycemia treatment (when your (blood sugars are less than 70 mg/dL)   STEP 1: Take 15 grams of carbohydrates when your blood sugar is low, which includes:  3-4 GLUCOSE TABS  OR 3-4 OZ OF JUICE OR REGULAR SODA OR ONE TUBE OF GLUCOSE GEL    STEP 2: RECHECK blood sugar in 15 MINUTES STEP 3: If your blood sugar is still low at the 15 minute recheck --> then, go back to STEP 1 and treat AGAIN with another 15 grams of carbohydrates.

## 2023-08-04 NOTE — Progress Notes (Signed)
 Name: Mario Green  MRN/ DOB: [id number], 1956-02-26   Age/ Sex: 68 y.o., male    PCP: Angelique Barer, MD   Reason for Endocrinology Evaluation: Type 2 Diabetes Mellitus     Date of Initial Endocrinology Visit: 06/22/2021    PATIENT IDENTIFIER: Mario Green is a 68 y.o. male with a past medical history of CAD, HTN, T2DM, CKD and depression . The patient presented for initial endocrinology clinic visit on 06/22/2021 for consultative assistance with his diabetes management.    HPI: [name] was    Diagnosed with DM years ago  Prior Medications tried/Intolerance: Metformin Hemoglobin A1c peaking at 8.0% in 04/2021.   Nephrology : Dr. Orson Blalock  Father with hx of DM    On his initial visit to our clinic his A1c was 7.6%  , he was on Levemir , Trulicity and Glimepiride. We switched Glimepiride to Novolog  and continued basal insulin  and Trulicity   He was started on Farxiga in August 2023 by PCP    SUBJECTIVE:   During last visit (02/04/2023): A1c 6.5%     Today (08/04/23):  [name] is here for a follow up on diabetes management.  He checks his glucose multiple times a day through freestyle libre, patient has been noted with hypoglycemi at variable times.   He continues to follow-up with nephrology Dr. Adrian Alba  Denies nausea, vomiting  Denies diarrhea but has constipation     HOME DIABETES REGIMEN: Novolog  8 units with Breakfast and Supper- pt assistance Ozempic  2 mg weekly - Pt assistance  Tresiba 38 units daily - pt assistance  Correction factor: NovoLog  (BG -130/30)     Statin: No, pt on Repatha   ACE-I/ARB: no Prior Diabetic Education: yes   CONTINUOUS GLUCOSE MONITORING RECORD INTERPRETATION    Dates of Recording: 4/14-4/27/2025  Sensor description:freestyle libre 3 +  Results statistics:   CGM use % of time 99  Average and SD 155/25.8  Time in range 74  %  % Time Above 180 25  % Time above 250 1  % Time Below target 0   Glycemic  patterns summary: Bg's are optimal  at night, and most of the day Hyperglycemic episodes postprandial  Hypoglycemic episodes occurred after bolus  Overnight periods: Optimal     DIABETIC COMPLICATIONS: Microvascular complications:  CKD IV Denies: retinopathy, neuropathy  Last eye exam: Completed 05/20/2022  Macrovascular complications:  CAD, carotid artery disease Denies: PVD, CVA   PAST HISTORY: Past Medical History:  Past Medical History:  Diagnosis Date   Acute respiratory failure (HCC)    Allergic rhinitis    Anemia    Benign essential hypertension    CAD (coronary artery disease)    Carotid artery stenosis 07/31/2021   Carotid stenosis 07/31/2021   CKD (chronic kidney disease), symptom management only, stage 3 (moderate) (HCC)    Diabetes mellitus (HCC) 06/22/2021   Dyslipidemia 03/24/2015   ED (erectile dysfunction)    Hematoma of neck    Hyperlipidemia    Morbid obesity due to excess calories (HCC) 03/24/2015   Obstructive sleep apnea 03/24/2015   patient denied having a sleep study done   Renal insufficiency    Second degree atrioventricular block, Mobitz (type) I 07/17/2021   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 LIMA to LAD, SVG to diagonal 1, SVG to PDA   Tinnitus, bilateral    Type 2 diabetes mellitus with stage 4 chronic kidney  disease, with long-term current use of insulin  (HCC) 06/22/2021   Type 2 diabetes mellitus without complication (HCC) 03/24/2015   Past Surgical History:  Past Surgical History:  Procedure Laterality Date   ADENOIDECTOMY     CARDIAC CATHETERIZATION     CARDIAC SURGERY     Tripple bypass   COLONOSCOPY  05/25/2012   Colonic polyps status post polypectomy Mild sigmoid diverticulosis. Small internal hemorrhoids. Otherwise grossly normal colonoscopy. The colonoscopy was limited due to quality of preparation.   ENDARTERECTOMY Right 07/31/2021    Procedure: Hematoma Evacuation Right Post Carotid Endarterectomy;  Surgeon: Adine Hoof, MD;  Location: [facility] OR;  Service: Vascular;  Laterality: Right;   ENDARTERECTOMY Right 07/31/2021   Procedure: RIGHT ENDARTERECTOMY CAROTID WITH PATCH ANGIOPLASTY;  Surgeon: Adine Hoof, MD;  Location: [facility] OR;  Service: Vascular;  Laterality: Right;    Social History:  reports that he has never smoked. He has never been exposed to tobacco smoke. He has quit using smokeless tobacco.  His smokeless tobacco use included chew. He reports that he does not currently use alcohol. He reports that he does not use drugs. Family History:  Family History  Problem Relation Age of Onset   Dementia Mother    Diabetes Father    Heart attack Father    Colon cancer Neg Hx    Esophageal cancer Neg Hx    Stomach cancer Neg Hx    Colon polyps Neg Hx      HOME MEDICATIONS: Allergies as of 08/04/2023   No Known Allergies      Medication List        Accurate as of August 04, 2023 11:17 AM. If you have any questions, ask your nurse or doctor.          Cartia  XT 240 MG 24 hr capsule Generic drug: diltiazem  Take 240 mg by mouth daily.   clopidogrel  75 MG tablet Commonly known as: PLAVIX  TAKE 1 TABLET BY MOUTH DAILY   Farxiga 5 MG Tabs tablet Generic drug: dapagliflozin propanediol Take 1 tablet by mouth daily.   Fish Oil 1000 MG Caps Take 2,000 mg by mouth every morning.   FreeStyle Libre 3 Sensor Misc Place 1 sensor on the skin every 15 days. Use to check glucose continuously   insulin  detemir 100 UNIT/ML FlexPen Commonly known as: LEVEMIR  Inject 40 Units into the skin daily.   Insulin  Pen Needle 29G X Misc 1 Device by Does not apply route in the morning, at noon, in the evening, and at bedtime.   NovoLOG  FlexPen 100 UNIT/ML FlexPen Generic drug: insulin  aspart Max daily 50 units   Ozempic  (2 MG/DOSE) 8 MG/3ML Sopn Generic drug: Semaglutide  (2 MG/DOSE) Inject 2 mg  into the skin once a week.   Repatha  SureClick 140 MG/ML Soaj Generic drug: Evolocumab  INJECT 140 MGS SUBCUTANEOUSLY EVERY 14 DAYS   tamsulosin  0.4 MG Caps capsule Commonly known as: FLOMAX  Take 0.4 mg by mouth at bedtime.         ALLERGIES: No Known Allergies   REVIEW OF SYSTEMS: A comprehensive ROS was conducted with the patient and is negative except as per HPI     OBJECTIVE:   VITAL SIGNS: BP 126/80 (BP Location: Left Arm, Patient Position: Sitting, Cuff Size: Normal)   Pulse 61   Ht 6' (1.829 m)   Wt 299 lb (135.6 kg)   SpO2 96%   BMI 40.55 kg/m    PHYSICAL EXAM:  General: Pt appears well and is in  NAD  Lungs: Clear with good BS bilat   Heart: RRR   Neuro: MS is good with appropriate affect, pt is alert and Ox3    DM Foot Exam 08/04/2023 The skin of the feet without sores or ulcerations, he does have healed scab at the toe from cutting his toenails The pedal pulses are 2+ on right and 2+ on left. The sensation is decreased to a screening 5.07, 10 gram monofilament bilaterally    DATA REVIEWED:   Patient signed ROI to obtain results from nephrology   Old records , labs and images have been reviewed.   ASSESSMENT / PLAN / RECOMMENDATIONS:   1) Type 2 Diabetes Mellitus, Optimally ptimally controlled, With CKD III , neuropathic and macrovascular  complications - Most recent A1c of 6.9%. Goal A1c < 7.0 %.    -A1c at goal but has trended up -Limited glycemic agents due to CKD III- IV -Patient is on patient assistance program for tresiba , Novolog  and Ozempic   - He continues to take NovoLog  once a day with breakfast, he has been skipping suppertime, I did encourage the patient to take NovoLog  before meals, he has the option of taking less than prescribed but he should take NovoLog  before meals.  He was noted with postprandial BG of 268 Mg/DL Saturday night - I will decrease his fasting NovoLog  dose as the patient endorses hypoglycemia when he is active  during the day   MEDICATIONS: Continue Ozempic  2 mg weekly Take NovoLog  6 units with breakfast and 5 units with supper Decrease Tresiba and take 38 units daily Continue correction factor: NovoLog  (BG -130/30)   EDUCATION / INSTRUCTIONS: BG monitoring instructions: Patient is instructed to check his blood sugars 3 times a day, before meals. Call [facility] if: BG persistently < 70 I reviewed the Rule of 15 for the treatment of hypoglycemia in detail with the patient. Literature supplied.   2) Diabetic complications:  Eye: Does not have known diabetic retinopathy.  Neuro/ Feet: Does  have known diabetic peripheral neuropathy. Renal: Patient does  have known baseline CKD. He is  on an ACEI/ARB at present.    3)CAD/Dyslipidemia/carotid artery disease:   -Per cardiology -Patient on Repatha     Follow-up in 6 months  I spent 25 minutes preparing to see the patient by review of recent labs, imaging and procedures, obtaining and reviewing separately obtained history, communicating with the patient, ordering medications, tests or procedures, and documenting clinical information in the EHR including the differential Dx, treatment, and any further evaluation and other management    Signed electronically by: Natale Bail, MD  [facility]  [facility] [address] [place] [place], [place] [address] Phone: [phone] FAX: [phone]   CC: Angelique Barer, MD [address] [place] [address] Phone: [phone]  Fax: [phone]    Return to Endocrinology clinic as below: No future appointments.

## 2023-09-02 ENCOUNTER — Other Ambulatory Visit: Payer: Self-pay

## 2023-09-02 MED ORDER — FREESTYLE LIBRE 3 PLUS SENSOR MISC: 3 refills | Status: AC

## 2023-10-06 ENCOUNTER — Telehealth: Payer: Self-pay | Admitting: Internal Medicine

## 2023-10-06 ENCOUNTER — Telehealth: Payer: Self-pay

## 2023-10-06 NOTE — Telephone Encounter (Signed)
 Libre needs PA

## 2023-10-06 NOTE — Telephone Encounter (Signed)
 Patient picked up Novolog , Ozempic , Pen needles, and Tresiba patient assistance.

## 2023-10-06 NOTE — Telephone Encounter (Signed)
 Patient came in to office today to pick up patient assistance meds and said he was told that the refill for Continuous Glucose Sensor (FREESTYLE LIBRE 3 PLUS SENSOR) needs prior authorization.  Patient uses   [facility] - [place], [place] - [address] (Ph: [phone])  For his pharmacy.  Patient is out of the sensors.

## 2023-10-07 ENCOUNTER — Other Ambulatory Visit (HOSPITAL_COMMUNITY): Payer: Self-pay

## 2023-10-07 ENCOUNTER — Telehealth: Payer: Self-pay

## 2023-10-07 NOTE — Telephone Encounter (Signed)
 Pharmacy Patient Advocate Encounter   Received notification from Pt Calls Messages that prior authorization for Freestyle libre 3 plus sensor is required/requested.   Insurance verification completed.   The patient is insured through [facility] .   Per test claim: PA required; PA submitted to above mentioned insurance via CoverMyMeds Key/confirmation #/EOC [id number] Status is pending

## 2023-10-13 NOTE — Telephone Encounter (Signed)
 Pharmacy Patient Advocate Encounter  Received notification from [employer] that Prior Authorization for [facility] 3 plus has been APPROVED from 10/07/23 to 04/07/2024   PA #/Case ID/Reference #:  [id number]

## 2023-10-24 ENCOUNTER — Other Ambulatory Visit: Payer: Self-pay | Admitting: Cardiology

## 2023-11-28 ENCOUNTER — Other Ambulatory Visit: Payer: Self-pay | Admitting: Internal Medicine

## 2023-11-28 DIAGNOSIS — Z794 Long term (current) use of insulin: Secondary | ICD-10-CM

## 2023-12-11 ENCOUNTER — Telehealth: Payer: Self-pay | Admitting: Pharmacy Technician

## 2023-12-11 ENCOUNTER — Other Ambulatory Visit (HOSPITAL_COMMUNITY): Payer: Self-pay

## 2023-12-11 NOTE — Telephone Encounter (Signed)
 Pharmacy Patient Advocate Encounter   Received notification from CoverMyMeds that prior authorization for repatha  is required/requested.   Insurance verification completed.   The patient is insured through [employer] .   Per test claim: PA required; PA submitted to above mentioned insurance via Latent Key/confirmation #/EOC [id number] Status is pending

## 2023-12-11 NOTE — Telephone Encounter (Signed)
 Pharmacy Patient Advocate Encounter  Received notification from [facility] that Prior Authorization for repatha  has been APPROVED from 12/11/23 to 04/07/24. Ran test claim, Copay is $0.00- one month. This test claim was processed through [facility]- copay amounts may vary at other pharmacies due to pharmacy/plan contracts, or as the patient moves through the different stages of their insurance plan.   PA #/Case ID/Reference #: [id number]

## 2023-12-30 NOTE — Telephone Encounter (Signed)
 Patient picked up patient assistance - Log Noted

## 2024-01-13 ENCOUNTER — Telehealth: Payer: Self-pay

## 2024-01-13 NOTE — Telephone Encounter (Signed)
 Received patient's patient assistance meds. Received Tresiba x 3. Patient called and made aware.

## 2024-01-19 ENCOUNTER — Other Ambulatory Visit: Payer: Self-pay | Admitting: Cardiology

## 2024-02-02 ENCOUNTER — Ambulatory Visit: Admitting: Internal Medicine

## 2024-02-02 ENCOUNTER — Encounter: Payer: Self-pay | Admitting: Internal Medicine

## 2024-02-02 VITALS — BP 120/80 | HR 68 | Ht 72.0 in | Wt 288.0 lb

## 2024-02-02 DIAGNOSIS — E1142 Type 2 diabetes mellitus with diabetic polyneuropathy: Secondary | ICD-10-CM | POA: Diagnosis not present

## 2024-02-02 DIAGNOSIS — E1159 Type 2 diabetes mellitus with other circulatory complications: Secondary | ICD-10-CM

## 2024-02-02 DIAGNOSIS — E1122 Type 2 diabetes mellitus with diabetic chronic kidney disease: Secondary | ICD-10-CM

## 2024-02-02 DIAGNOSIS — Z794 Long term (current) use of insulin: Secondary | ICD-10-CM | POA: Diagnosis not present

## 2024-02-02 DIAGNOSIS — N1832 Chronic kidney disease, stage 3b: Secondary | ICD-10-CM | POA: Diagnosis not present

## 2024-02-02 LAB — POCT GLYCOSYLATED HEMOGLOBIN (HGB A1C): Hemoglobin A1C: 6.9 % — AB (ref 4.0–5.6)

## 2024-02-02 NOTE — Progress Notes (Signed)
 Name: Mario Green  MRN/ DOB: [id number], [birth year]   Age/ Sex: 68 y.o., male    PCP: Mario Chow, MD   Reason for Green Evaluation: Type 2 Diabetes Mellitus     Date of Initial Green Visit: 06/22/2021    PATIENT IDENTIFIER: [name] is a 68 y.o. male with a past medical history of CAD, HTN, T2DM, CKD and depression . The patient presented for initial Green clinic visit on 06/22/2021 for consultative assistance with his diabetes management.    HPI: Mario Green was    Diagnosed with DM years ago  Prior Medications tried/Intolerance: Metformin Hemoglobin A1c peaking at 8.0% in 04/2021.   Nephrology : Dr. Hildreth  Mario Green with hx of DM    On his initial visit to our clinic his A1c was 7.6%  , he was on Levemir , Trulicity and Glimepiride. We switched Glimepiride to Novolog  and continued basal insulin  and Trulicity   He was started on Farxiga in August 2023 by PCP    SUBJECTIVE:   During last visit (08/04/2023): A1c 6.9%     Today (02/02/24):  Mario Green is here for a follow up on diabetes management.  He checks his glucose multiple times a day through freestyle libre, patient has been noted with hypoglycemi at variable times. He is symptomatic   He continues to follow-up with nephrology Dr. Manuelita Green He self decreased tresiba from 38 units to 26-30 units due to hypoglycemia after changing his diet  Denies nausea, vomiting  Denies diarrhea but has constipation  Has occasional back pain, he has been using a cocktail of herbal/natural supplements that was used by his friend, he does add a tablespoon of honey.    HOME DIABETES REGIMEN: Novolog  6 units with Breakfast and Supper- pt assistance-has not been consistent with this Ozempic  2 mg weekly - Pt assistance  Tresiba 38 units daily - pt assistance  takes 26-30 units Correction factor: NovoLog  (BG -130/30)     Statin: No, pt on Repatha   ACE-I/ARB: no Prior Diabetic Education:  yes   CONTINUOUS GLUCOSE MONITORING RECORD INTERPRETATION    Dates of Recording: 10/14-10/27/2025  Sensor description:freestyle libre 3 +  Results statistics:   CGM use % of time 94  Average and SD 181/28.8  Time in range 56 %  % Time Above 180 33  % Time above 250 11  % Time Below target 0   Glycemic patterns summary: BGs are optimal overnight and increased during the day Hyperglycemic episodes postprandial  Hypoglycemic episodes occurred at variable times  Overnight periods: Mostly optimal     DIABETIC COMPLICATIONS: Microvascular complications:  CKD IV Denies: retinopathy, neuropathy  Last eye exam: Completed 05/20/2022  Macrovascular complications:  CAD, carotid artery disease Denies: PVD, CVA   PAST HISTORY: Past Medical History:  Past Medical History:  Diagnosis Date   Acute respiratory failure (HCC)    Allergic rhinitis    Anemia    Benign essential hypertension    CAD (coronary artery disease)    Carotid artery stenosis 07/31/2021   Carotid stenosis 07/31/2021   CKD (chronic kidney disease), symptom management only, stage 3 (moderate) (HCC)    Diabetes mellitus (HCC) 06/22/2021   Dyslipidemia 03/24/2015   ED (erectile dysfunction)    Hematoma of neck    Hyperlipidemia    Morbid obesity due to excess calories (HCC) 03/24/2015   Obstructive sleep apnea 03/24/2015   patient denied having a sleep study done   Renal insufficiency    Second degree  atrioventricular block, Mobitz (type) I 07/17/2021   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 LIMA to LAD, SVG to diagonal 1, SVG to PDA   Tinnitus, bilateral    Type 2 diabetes mellitus with stage 4 chronic kidney disease, with long-term current use of insulin  (HCC) 06/22/2021   Type 2 diabetes mellitus without complication 03/24/2015   Past Surgical History:  Past Surgical History:  Procedure Laterality Date   ADENOIDECTOMY      CARDIAC CATHETERIZATION     CARDIAC SURGERY     Tripple bypass   COLONOSCOPY  05/25/2012   Colonic polyps status post polypectomy Mild sigmoid diverticulosis. Small internal hemorrhoids. Otherwise grossly normal colonoscopy. The colonoscopy was limited due to quality of preparation.   ENDARTERECTOMY Right 07/31/2021   Procedure: Hematoma Evacuation Right Post Carotid Endarterectomy;  Surgeon: Mario Penne Bruckner, MD;  Location: [facility] OR;  Service: Vascular;  Laterality: Right;   ENDARTERECTOMY Right 07/31/2021   Procedure: RIGHT ENDARTERECTOMY CAROTID WITH PATCH ANGIOPLASTY;  Surgeon: Mario Penne Bruckner, MD;  Location: [facility] OR;  Service: Vascular;  Laterality: Right;    Social History:  reports that he has never smoked. He has never been exposed to tobacco smoke. He has quit using smokeless tobacco.  His smokeless tobacco use included chew. He reports that he does not currently use alcohol. He reports that he does not use drugs. Family History:  Family History  Problem Relation Age of Onset   Dementia Mother    Diabetes Mario Green    Heart attack Mario Green    Colon cancer Neg Hx    Esophageal cancer Neg Hx    Stomach cancer Neg Hx    Colon polyps Neg Hx      HOME MEDICATIONS: Allergies as of 02/02/2024   No Known Allergies      Medication List        Accurate as of February 02, 2024 11:37 AM. If you have any questions, ask your nurse or doctor.          Cartia  XT 240 MG 24 hr capsule Generic drug: diltiazem  Take 240 mg by mouth daily.   clopidogrel  75 MG tablet Commonly known as: PLAVIX  TAKE 1 TABLET BY MOUTH DAILY   Farxiga 5 MG Tabs tablet Generic drug: dapagliflozin propanediol Take 1 tablet by mouth daily.   Fish Oil 1000 MG Caps Take 2,000 mg by mouth every morning.   FreeStyle Libre 3 Sensor Misc Place 1 sensor on the skin every 15 days. Use to check glucose continuously   FreeStyle Libre 3 Plus Sensor Misc Change sensor every 15 days.   insulin   detemir 100 UNIT/ML FlexPen Commonly known as: LEVEMIR  Inject 40 Units into the skin daily.   Insulin  Pen Needle 29G X Misc 1 Device by Does not apply route in the morning, at noon, in the evening, and at bedtime.   NovoLOG  FlexPen 100 UNIT/ML FlexPen Generic drug: insulin  aspart Max daily 50 units   Ozempic  (2 MG/DOSE) 8 MG/3ML Sopn Generic drug: Semaglutide  (2 MG/DOSE) INJECT 2 MGS SUBCUTANEOUSLY ONCE WEEKLY   Repatha  SureClick 140 MG/ML Soaj Generic drug: Evolocumab  INJECT 140 MGS SUBCUTANEOUSLY EVERY 14 DAYS   tamsulosin  0.4 MG Caps capsule Commonly known as: FLOMAX  Take 0.4 mg by mouth at bedtime.         ALLERGIES: No Known Allergies   REVIEW OF SYSTEMS: A comprehensive ROS was conducted with the patient and is negative except as  per HPI     OBJECTIVE:   VITAL SIGNS: BP 120/80 (BP Location: Left Arm, Patient Position: Sitting, Cuff Size: Normal)   Pulse 68   Ht 6' (1.829 m)   Wt 288 lb (130.6 kg)   SpO2 98%   BMI 39.06 kg/m    PHYSICAL EXAM:  General: Pt appears well and is in NAD  Lungs: Clear with good BS bilat   Heart: RRR   Neuro: MS is good with appropriate affect, pt is alert and Ox3    DM Foot Exam 08/04/2023 The skin of the feet without sores or ulcerations, he does have healed scab at the toe from cutting his toenails The pedal pulses are 2+ on right and 2+ on left. The sensation is decreased to a screening 5.07, 10 gram monofilament bilaterally    DATA REVIEWED:   06/06/23 10:54  Calcium 9.3 (E)  eGFR 38.0 (E)  (E): External lab result ASSESSMENT / PLAN / RECOMMENDATIONS:   1) Type 2 Diabetes Mellitus, Optimally ptimally controlled, With CKD III , neuropathic and macrovascular  complications - Most recent A1c of 6.9%. Goal A1c < 7.0 %.    -A1c remains at goal -Limited glycemic agents due to CKD III- IV -Patient is on patient assistance program for tresiba , Novolog  and Ozempic   - He has self decreased his basal insulin , I  did advise the patient that he should decrease his insulin  by 4 units at a time and to wait a week prior to making any further adjustments on the basal insulin  - He has been noted with hyperglycemia during the day, he has not been consistent with taking NovoLog  with each meal.  I have encouraged the patient to use correction scale before each meal - I did advise the patient that I have no knowledge of herbal/natural supplements, but I would avoid sugar, patient uses honey for a back pain cocktail, I did advise the patient to use a teaspoon rather than a tablespoon   MEDICATIONS: Continue Ozempic  2 mg weekly Take Tresiba 30 units daily Continue correction factor: NovoLog  (BG -130/30) TIDQAC   EDUCATION / INSTRUCTIONS: BG monitoring instructions: Patient is instructed to check his blood sugars 3 times a day, before meals. Call Mario Green clinic if: BG persistently < 70 I reviewed the Rule of 15 for the treatment of hypoglycemia in detail with the patient. Literature supplied.   2) Diabetic complications:  Eye: Does not have known diabetic retinopathy.  Neuro/ Feet: Does  have known diabetic peripheral neuropathy. Renal: Patient does  have known baseline CKD. He is  on an ACEI/ARB at present.    3)CAD/Dyslipidemia/carotid artery disease:   -Per cardiology -Patient on Repatha     Follow-up in 6 months    Signed electronically by: Stefano Redgie Butts, MD  [facility]  [facility] [address] [place], [place] [address] Phone: [phone] FAX: [phone]   CC: Mario Chow, MD [address] [place] [address] Phone: [phone]  Fax: [phone]    Return to Green clinic as below: Future Appointments  Date Time Provider Department Center  02/05/2024  1:20 PM Monetta Redell PARAS, MD [facility] None

## 2024-02-02 NOTE — Patient Instructions (Signed)
-   Continue Ozempic  2 mg once weekly  - Take Tresiba 30  units ONCE daily  - Novolog  correctional insulin : ADD extra units on insulin  to your meal-time Novolog  dose if your blood sugars are higher than 160. Use the scale below to help guide you:   Blood sugar before meal Number of units to inject  Less than 160 0 unit  161 -  190 1 units  191 -  220 2 units  221 -  250 3 units  251 -  280 4 units  281 -  310 5 units  311 -  340 6 units  341 -  370 7 units  371 -  400 8 units     HOW TO TREAT LOW BLOOD SUGARS (Blood sugar LESS THAN 70 MG/DL) Please follow the RULE OF 15 for the treatment of hypoglycemia treatment (when your (blood sugars are less than 70 mg/dL)   STEP 1: Take 15 grams of carbohydrates when your blood sugar is low, which includes:  3-4 GLUCOSE TABS  OR 3-4 OZ OF JUICE OR REGULAR SODA OR ONE TUBE OF GLUCOSE GEL    STEP 2: RECHECK blood sugar in 15 MINUTES STEP 3: If your blood sugar is still low at the 15 minute recheck --> then, go back to STEP 1 and treat AGAIN with another 15 grams of carbohydrates.

## 2024-02-02 NOTE — Telephone Encounter (Signed)
 Patient picked up patient assistance - Log Noted Tresiban and Novo fine on 02/02/24

## 2024-02-03 ENCOUNTER — Encounter: Payer: Self-pay | Admitting: Internal Medicine

## 2024-02-03 ENCOUNTER — Other Ambulatory Visit: Payer: Self-pay

## 2024-02-04 NOTE — Progress Notes (Unsigned)
 Cardiology Office Note:    Date:  02/05/2024   ID:  Mario Green, DOB 04/24/1955, MRN [id number]  PCP:  Jama Chow, MD  Cardiologist:  Redell Leiter, MD    Referring MD: Jama Chow, MD    ASSESSMENT:    1. Coronary artery disease of native artery of native heart with stable angina pectoris   2. CKD (chronic kidney disease), symptom management only, stage 3 (moderate) (HCC)   3. Mixed hyperlipidemia   4. Primary hypertension   5. Sinus bradycardia    PLAN:    In order of problems listed above:  Stable CAD having no cardiovascular symptoms continue his medical therapy with clopidogrel  and lipid-lowering nonstatin Repatha   stable CKD Continue Repatha  will check a lipid profile along with APO B for optimization BP is at target currently on no antihypertensive agents Avoid rate slowing medications   Next appointment: 6 months   Medication Adjustments/Labs and Tests Ordered: Current medicines are reviewed at length with the patient today.  Concerns regarding medicines are outlined above.  No orders of the defined types were placed in this encounter.  No orders of the defined types were placed in this encounter.    History of Present Illness:    Mario Green is a 68 y.o. male with a hx of CAD with CABG 2016 stage III CKD type 2 diabetes hyperlipidemia and carotid disease with previous right carotid endarterectomy last seen 11/07/2022.  Compliance with diet, lifestyle and medications: Yes While I was on medical leave he had a slow heart rate there was some medication that contributed to me asked me the name of it and I have gone through the record I cannot find the documentation on the day he came to the office what the medication was and what was stopped he has resting sinus bradycardia and I told him to avoid rate slowing medications in the future He is working with endocrinology diabetic control and weight loss on semaglutide  He is not having cardiovascular symptoms  of edema shortness of breath chest pain palpitation or syncope    Current Medications: Current Meds  Medication Sig   clopidogrel  (PLAVIX ) 75 MG tablet TAKE 1 TABLET BY MOUTH DAILY   Continuous Glucose Sensor (FREESTYLE LIBRE 3 PLUS SENSOR) MISC Change sensor every 15 days.   Continuous Glucose Sensor (FREESTYLE LIBRE 3 SENSOR) MISC Place 1 sensor on the skin every 15 days. Use to check glucose continuously   FARXIGA 5 MG TABS tablet Take 1 tablet by mouth daily.   insulin  aspart (NOVOLOG  FLEXPEN) 100 UNIT/ML FlexPen Max daily 50 units   insulin  detemir (LEVEMIR ) 100 UNIT/ML FlexPen Inject 40 Units into the skin daily.   Insulin  Pen Needle 29G X MISC 1 Device by Does not apply route in the morning, at noon, in the evening, and at bedtime.   Omega-3 Fatty Acids (FISH OIL) 1000 MG CAPS Take 2,000 mg by mouth every morning.   OZEMPIC , 2 MG/DOSE, 8 MG/3ML SOPN INJECT 2 MGS SUBCUTANEOUSLY ONCE WEEKLY   REPATHA  SURECLICK 140 MG/ML SOAJ INJECT 140 MGS SUBCUTANEOUSLY EVERY 14 DAYS   tamsulosin  (FLOMAX ) 0.4 MG CAPS capsule Take 0.4 mg by mouth at bedtime.   Current Facility-Administered Medications for the 02/05/24 encounter (Office Visit) with [name], MD  Medication   0.9 %  sodium chloride  infusion      EKGs/Labs/Other Studies Reviewed:    The following studies were reviewed today:  Cardiac Studies & Procedures   ______________________________________________________________________________________________  ECHOCARDIOGRAM  ECHOCARDIOGRAM COMPLETE 08/01/2021  Narrative ECHOCARDIOGRAM REPORT    Patient Name:   Mario Green Date of Exam: 08/01/2021 Medical Rec #:  [id number]       Height:       72.0 in Accession #:    [id number]      Weight:       300.0 lb Date of Birth:  Aug 27, 1955       BSA:          2.531 m Patient Age:    65 years        BP:           138/74 mmHg Patient Gender: M               HR:           45 bpm. Exam Location:  Inpatient  Procedure: 2D  Echo, Cardiac Doppler, Color Doppler and Intracardiac Opacification Agent  Indications:    I42.9 Cardiomyopathy (unspecified)  History:        Patient has no prior history of Echocardiogram examinations. CAD, Prior CABG, Signs/Symptoms:Dyspnea and Shortness of Breath; Risk Factors:Diabetes and Sleep Apnea.  Sonographer:    Ellouise Mose RDCS Referring Phys: [id number] Mario Green   Sonographer Comments: Technically difficult study due to poor echo windows and patient is morbidly obese. Image acquisition challenging due to respiratory motion. IMPRESSIONS   1. Left ventricular ejection fraction, by estimation, is 55 to 60%. The left ventricle has normal function. The left ventricle has no regional wall motion abnormalities. There is mild concentric left ventricular hypertrophy. Left ventricular diastolic parameters are consistent with Grade II diastolic dysfunction (pseudonormalization). Elevated left ventricular end-diastolic pressure. 2. Right ventricular systolic function is normal. The right ventricular size is normal. 3. Left atrial size was mildly dilated. 4. Right atrial size was mildly dilated. 5. The mitral valve is normal in structure. No evidence of mitral valve regurgitation. No evidence of mitral stenosis. 6. The aortic valve is normal in structure. Aortic valve regurgitation is not visualized. No aortic stenosis is present. 7. Aortic dilatation noted. There is mild dilatation of the aortic root, measuring 37 mm. There is mild dilatation of the ascending aorta, measuring 40 mm.  FINDINGS Left Ventricle: Left ventricular ejection fraction, by estimation, is 55 to 60%. The left ventricle has normal function. The left ventricle has no regional wall motion abnormalities. Definity  contrast agent was given IV to delineate the left ventricular endocardial borders. The left ventricular internal cavity size was normal in size. There is mild concentric left ventricular hypertrophy. Left  ventricular diastolic parameters are consistent with Grade II diastolic dysfunction (pseudonormalization). Elevated left ventricular end-diastolic pressure.  Right Ventricle: The right ventricular size is normal. No increase in right ventricular wall thickness. Right ventricular systolic function is normal.  Left Atrium: Left atrial size was mildly dilated.  Right Atrium: Right atrial size was mildly dilated.  Pericardium: There is no evidence of pericardial effusion.  Mitral Valve: The mitral valve is normal in structure. No evidence of mitral valve regurgitation. No evidence of mitral valve stenosis.  Tricuspid Valve: The tricuspid valve is normal in structure. Tricuspid valve regurgitation is trivial. No evidence of tricuspid stenosis.  Aortic Valve: The aortic valve is normal in structure. Aortic valve regurgitation is not visualized. No aortic stenosis is present.  Pulmonic Valve: The pulmonic valve was normal in structure. Pulmonic valve regurgitation is not visualized. No evidence of pulmonic stenosis.  Aorta: The ascending aorta was not well visualized and  aortic dilatation noted. There is mild dilatation of the aortic root, measuring 37 mm. There is mild dilatation of the ascending aorta, measuring 40 mm.  Venous: IVC assessment for right atrial pressure unable to be performed due to mechanical ventilation.  IAS/Shunts: No atrial level shunt detected by color flow Doppler.   LEFT VENTRICLE PLAX 2D LVIDd:         5.00 cm      Diastology LVIDs:         3.35 cm      LV e' medial:    5.48 cm/s LV PW:         1.30 cm      LV E/e' medial:  21.4 LV IVS:        1.30 cm      LV e' lateral:   7.35 cm/s LVOT diam:     2.20 cm      LV E/e' lateral: 15.9 LV SV:         102 LV SV Index:   40 LVOT Area:     3.80 cm  LV Volumes (MOD) LV vol d, MOD A2C: 162.0 ml LV vol d, MOD A4C: 167.0 ml LV vol s, MOD A2C: 79.0 ml LV vol s, MOD A4C: 56.9 ml LV SV MOD A2C:     83.0 ml LV SV MOD  A4C:     167.0 ml LV SV MOD BP:      99.3 ml  RIGHT VENTRICLE             IVC RV S prime:     12.38 cm/s  IVC diam: 2.20 cm TAPSE (M-mode): 1.6 cm  LEFT ATRIUM             Index        RIGHT ATRIUM           Index LA diam:        3.40 cm 1.34 cm/m   RA Area:     26.00 cm LA Vol (A2C):   81.5 ml 32.20 ml/m  RA Volume:   76.10 ml  30.06 ml/m LA Vol (A4C):   77.8 ml 30.74 ml/m LA Biplane Vol: 82.7 ml 32.67 ml/m AORTIC VALVE LVOT Vmax:   124.00 cm/s LVOT Vmean:  77.400 cm/s LVOT VTI:    0.269 m  AORTA Ao Root diam: 3.70 cm Ao Asc diam:  4.10 cm  MITRAL VALVE                TRICUSPID VALVE MV Area (PHT): 3.21 cm     TR Peak grad:   16.8 mmHg MV Decel Time: 236 msec     TR Vmax:        205.00 cm/s MV E velocity: 117.00 cm/s MV A velocity: 100.00 cm/s  SHUNTS MV E/A ratio:  1.17         Systemic VTI:  0.27 m Systemic Diam: 2.20 cm  Annabella Scarce MD Electronically signed by Annabella Scarce MD Signature Date/Time: 08/01/2021/10:43:01 AM    Final    MONITORS  LONG TERM MONITOR (3-14 DAYS) 06/09/2023  Narrative Patch Wear Time:  8 days and 15 hours (2025-02-13T11:09:20-0500 to 2025-02-22T02:36:06-498)  Patient had a min HR of 26 bpm, max HR of 131 bpm, and avg HR of 60 bpm. Predominant underlying rhythm was Sinus Rhythm. First Degree AV Block was present. Bundle Branch Block/IVCD was present. Second Degree AV Block-Mobitz I (Wenckebach) was present. Isolated SVEs were rare (<1.0%), and no SVE Couplets or SVE  Triplets were present. Isolated VEs were rare (<1.0%, 392), VE Couplets were rare (<1.0%, 33), and VE Triplets were rare (<1.0%, 1). Ventricular Bigeminy was present.  Summary and conclusions: Episode of second-degree type I AV block noted during the night.  With effective heart rate of 26 with 221 conduction. No symptoms entered into the diary       ______________________________________________________________________________________________      Recent  Labs: 08/04/2023 A1c 6.9 hemoglobin 17.1 creatinine 2.9   Physical Exam:    VS:  BP 126/80   Pulse (!) 54   Ht 6' (1.829 m)   Wt 290 lb 9.6 oz (131.8 kg)   SpO2 96%   BMI 39.41 kg/m     Wt Readings from Last 3 Encounters:  02/05/24 290 lb 9.6 oz (131.8 kg)  02/02/24 288 lb (130.6 kg)  08/04/23 299 lb (135.6 kg)     GEN:  Well nourished, well developed in no acute distress HEENT: Normal NECK: No JVD; No carotid bruits LYMPHATICS: No lymphadenopathy CARDIAC: RRR, no murmurs, rubs, gallops RESPIRATORY:  Clear to auscultation without rales, wheezing or rhonchi  ABDOMEN: Soft, non-tender, non-distended MUSCULOSKELETAL:  No edema; No deformity  SKIN: Warm and dry NEUROLOGIC:  Alert and oriented x 3 PSYCHIATRIC:  Normal affect    Signed, Redell Leiter, MD  02/05/2024 1:55 PM    [facility]

## 2024-02-05 ENCOUNTER — Ambulatory Visit: Attending: Cardiology | Admitting: Cardiology

## 2024-02-05 VITALS — BP 126/80 | HR 54 | Ht 72.0 in | Wt 290.6 lb

## 2024-02-05 DIAGNOSIS — E782 Mixed hyperlipidemia: Secondary | ICD-10-CM

## 2024-02-05 DIAGNOSIS — N183 Chronic kidney disease, stage 3 unspecified: Secondary | ICD-10-CM | POA: Diagnosis not present

## 2024-02-05 DIAGNOSIS — I10 Essential (primary) hypertension: Secondary | ICD-10-CM

## 2024-02-05 DIAGNOSIS — I25118 Atherosclerotic heart disease of native coronary artery with other forms of angina pectoris: Secondary | ICD-10-CM

## 2024-02-05 DIAGNOSIS — R001 Bradycardia, unspecified: Secondary | ICD-10-CM

## 2024-02-05 NOTE — Patient Instructions (Signed)
 Medication Instructions:  Your physician recommends that you continue on your current medications as directed. Please refer to the Current Medication list given to you today.  *If you need a refill on your cardiac medications before your next appointment, please call your pharmacy*  Lab Work: Your physician recommends that you return for lab work in:  Labs today: CMP, Lipids, Apo B  If you have labs (blood work) drawn today and your tests are completely normal, you will receive your results only by: MyChart Message (if you have MyChart) OR A paper copy in the mail If you have any lab test that is abnormal or we need to change your treatment, we will call you to review the results.  Testing/Procedures: None  Follow-Up: At [facility], you and your health needs are our priority.  As part of our continuing mission to provide you with exceptional heart care, our providers are all part of one team.  This team includes your primary Cardiologist (physician) and Advanced Practice Providers or APPs (Physician Assistants and Nurse Practitioners) who all work together to provide you with the care you need, when you need it.  Your next appointment:   9 month(s)  Provider:   Redell Leiter, MD    We recommend signing up for the patient portal called MyChart.  Sign up information is provided on this After Visit Summary.  MyChart is used to connect with patients for Virtual Visits (Telemedicine).  Patients are able to view lab/test results, encounter notes, upcoming appointments, etc.  Non-urgent messages can be sent to your provider as well.   To learn more about what you can do with MyChart, go to [email or web address].   Other Instructions None

## 2024-02-06 LAB — COMPREHENSIVE METABOLIC PANEL WITH GFR
ALT: 14 IU/L (ref 0–44)
AST: 16 IU/L (ref 0–40)
Albumin: 4.1 g/dL (ref 3.9–4.9)
Alkaline Phosphatase: 47 IU/L (ref 47–123)
BUN/Creatinine Ratio: 19 (ref 10–24)
BUN: 40 mg/dL — ABNORMAL HIGH (ref 8–27)
Bilirubin Total: 0.4 mg/dL (ref 0.0–1.2)
CO2: 20 mmol/L (ref 20–29)
Calcium: 8.6 mg/dL (ref 8.6–10.2)
Chloride: 104 mmol/L (ref 96–106)
Creatinine, Ser: 2.13 mg/dL — ABNORMAL HIGH (ref 0.76–1.27)
Globulin, Total: 2.6 g/dL (ref 1.5–4.5)
Glucose: 102 mg/dL — ABNORMAL HIGH (ref 70–99)
Potassium: 4.8 mmol/L (ref 3.5–5.2)
Sodium: 140 mmol/L (ref 134–144)
Total Protein: 6.7 g/dL (ref 6.0–8.5)
eGFR: 33 mL/min/1.73 — ABNORMAL LOW (ref >59)

## 2024-02-06 LAB — LIPID PANEL
Chol/HDL Ratio: 4.4 ratio (ref 0.0–5.0)
Cholesterol, Total: 150 mg/dL (ref 100–199)
HDL: 34 mg/dL — ABNORMAL LOW (ref >39)
LDL Chol Calc (NIH): 86 mg/dL (ref 0–99)
Triglycerides: 170 mg/dL — ABNORMAL HIGH (ref 0–149)
VLDL Cholesterol Cal: 30 mg/dL (ref 5–40)

## 2024-02-06 LAB — APOLIPOPROTEIN B: Apolipoprotein B: 86 mg/dL (ref <90)

## 2024-02-09 ENCOUNTER — Ambulatory Visit: Payer: Self-pay

## 2024-02-20 ENCOUNTER — Other Ambulatory Visit: Payer: Self-pay | Admitting: Cardiology

## 2024-02-20 DIAGNOSIS — I25118 Atherosclerotic heart disease of native coronary artery with other forms of angina pectoris: Secondary | ICD-10-CM

## 2024-02-20 DIAGNOSIS — E782 Mixed hyperlipidemia: Secondary | ICD-10-CM

## 2024-02-23 ENCOUNTER — Other Ambulatory Visit: Payer: Self-pay | Admitting: Cardiology

## 2024-04-23 ENCOUNTER — Other Ambulatory Visit (HOSPITAL_COMMUNITY): Payer: Self-pay

## 2024-04-23 ENCOUNTER — Telehealth: Payer: Self-pay

## 2024-04-23 NOTE — Telephone Encounter (Signed)
 Pharmacy Patient Advocate Encounter   Received notification from [employer] CMM KEY that prior authorization for Freestyle libre 3 plus sensor is required/requested.   Insurance verification completed.   The patient is insured through [employer].   Per test claim: The current 30 day co-pay is, $0.  No PA needed at this time. This test claim was processed through [facility]- copay amounts may vary at other pharmacies due to pharmacy/plan contracts, or as the patient moves through the different stages of their insurance plan.     Medication is not eligible for pharmacy benefits and must be billed through medical insurance. As our team only handles pharmacy related prior auths, medical PA's must be submitted by the clinic. Thank you

## 2024-04-23 NOTE — Telephone Encounter (Signed)
 Order placed  along with  chart notes uploaded in parachute

## 2024-08-02 ENCOUNTER — Ambulatory Visit: Admitting: Internal Medicine
